# Patient Record
Sex: Female | Born: 1960 | Race: Black or African American | Hispanic: No | Marital: Married | Smoking: Current every day smoker
Health system: Southern US, Community
[De-identification: ages and names within clinical notes are randomized; demographics above are authoritative.]

## PROBLEM LIST (undated history)

## (undated) DIAGNOSIS — G43909 Migraine, unspecified, not intractable, without status migrainosus: Secondary | ICD-10-CM

## (undated) DIAGNOSIS — C50919 Malignant neoplasm of unspecified site of unspecified female breast: Secondary | ICD-10-CM

## (undated) DIAGNOSIS — Z923 Personal history of irradiation: Secondary | ICD-10-CM

## (undated) DIAGNOSIS — F329 Major depressive disorder, single episode, unspecified: Secondary | ICD-10-CM

## (undated) DIAGNOSIS — F32A Depression, unspecified: Secondary | ICD-10-CM

## (undated) DIAGNOSIS — I10 Essential (primary) hypertension: Secondary | ICD-10-CM

## (undated) DIAGNOSIS — R011 Cardiac murmur, unspecified: Secondary | ICD-10-CM

## (undated) DIAGNOSIS — K219 Gastro-esophageal reflux disease without esophagitis: Secondary | ICD-10-CM

## (undated) DIAGNOSIS — R7303 Prediabetes: Secondary | ICD-10-CM

## (undated) DIAGNOSIS — C50912 Malignant neoplasm of unspecified site of left female breast: Secondary | ICD-10-CM

## (undated) HISTORY — PX: DILATION AND CURETTAGE OF UTERUS: SHX78

## (undated) HISTORY — PX: COLONOSCOPY: SHX174

## (undated) HISTORY — PX: TUBAL LIGATION: SHX77

## (undated) HISTORY — DX: Prediabetes: R73.03

---

## 1997-08-07 ENCOUNTER — Ambulatory Visit (HOSPITAL_COMMUNITY): Admission: RE | Admit: 1997-08-07 | Discharge: 1997-08-07 | Payer: Self-pay | Admitting: *Deleted

## 1999-11-07 ENCOUNTER — Emergency Department (HOSPITAL_COMMUNITY): Admission: EM | Admit: 1999-11-07 | Discharge: 1999-11-07 | Payer: Self-pay | Admitting: Emergency Medicine

## 2001-02-14 ENCOUNTER — Ambulatory Visit (HOSPITAL_COMMUNITY): Admission: RE | Admit: 2001-02-14 | Discharge: 2001-02-14 | Payer: Self-pay | Admitting: Emergency Medicine

## 2001-02-14 ENCOUNTER — Encounter: Payer: Self-pay | Admitting: Emergency Medicine

## 2001-12-14 ENCOUNTER — Other Ambulatory Visit: Admission: RE | Admit: 2001-12-14 | Discharge: 2001-12-14 | Payer: Self-pay | Admitting: Emergency Medicine

## 2002-12-26 ENCOUNTER — Other Ambulatory Visit: Admission: RE | Admit: 2002-12-26 | Discharge: 2002-12-26 | Payer: Self-pay | Admitting: Emergency Medicine

## 2003-01-23 ENCOUNTER — Encounter: Payer: Self-pay | Admitting: Emergency Medicine

## 2003-01-23 ENCOUNTER — Encounter: Admission: RE | Admit: 2003-01-23 | Discharge: 2003-01-23 | Payer: Self-pay | Admitting: Emergency Medicine

## 2003-12-29 ENCOUNTER — Other Ambulatory Visit: Admission: RE | Admit: 2003-12-29 | Discharge: 2003-12-29 | Payer: Self-pay | Admitting: Emergency Medicine

## 2004-01-08 DIAGNOSIS — C50912 Malignant neoplasm of unspecified site of left female breast: Secondary | ICD-10-CM

## 2004-01-08 HISTORY — PX: BREAST LUMPECTOMY: SHX2

## 2004-01-08 HISTORY — PX: BREAST BIOPSY: SHX20

## 2004-01-08 HISTORY — DX: Malignant neoplasm of unspecified site of left female breast: C50.912

## 2004-01-26 ENCOUNTER — Encounter: Admission: RE | Admit: 2004-01-26 | Discharge: 2004-01-26 | Payer: Self-pay | Admitting: Emergency Medicine

## 2004-01-29 ENCOUNTER — Encounter (INDEPENDENT_AMBULATORY_CARE_PROVIDER_SITE_OTHER): Payer: Self-pay | Admitting: *Deleted

## 2004-01-29 ENCOUNTER — Encounter: Admission: RE | Admit: 2004-01-29 | Discharge: 2004-01-29 | Payer: Self-pay | Admitting: Emergency Medicine

## 2004-01-29 ENCOUNTER — Encounter (INDEPENDENT_AMBULATORY_CARE_PROVIDER_SITE_OTHER): Payer: Self-pay | Admitting: Diagnostic Radiology

## 2004-02-03 ENCOUNTER — Encounter: Admission: RE | Admit: 2004-02-03 | Discharge: 2004-02-03 | Payer: Self-pay | Admitting: General Surgery

## 2004-02-09 ENCOUNTER — Ambulatory Visit (HOSPITAL_COMMUNITY): Admission: RE | Admit: 2004-02-09 | Discharge: 2004-02-09 | Payer: Self-pay | Admitting: General Surgery

## 2004-02-09 ENCOUNTER — Encounter (INDEPENDENT_AMBULATORY_CARE_PROVIDER_SITE_OTHER): Payer: Self-pay | Admitting: *Deleted

## 2004-02-09 ENCOUNTER — Encounter: Admission: RE | Admit: 2004-02-09 | Discharge: 2004-02-09 | Payer: Self-pay | Admitting: General Surgery

## 2004-02-09 ENCOUNTER — Ambulatory Visit (HOSPITAL_BASED_OUTPATIENT_CLINIC_OR_DEPARTMENT_OTHER): Admission: RE | Admit: 2004-02-09 | Discharge: 2004-02-09 | Payer: Self-pay | Admitting: General Surgery

## 2004-02-09 DIAGNOSIS — D0512 Intraductal carcinoma in situ of left breast: Secondary | ICD-10-CM | POA: Insufficient documentation

## 2004-02-09 HISTORY — DX: Intraductal carcinoma in situ of left breast: D05.12

## 2004-02-24 ENCOUNTER — Ambulatory Visit: Admission: RE | Admit: 2004-02-24 | Discharge: 2004-05-13 | Payer: Self-pay | Admitting: Radiation Oncology

## 2004-04-21 ENCOUNTER — Ambulatory Visit: Payer: Self-pay | Admitting: Oncology

## 2004-06-16 ENCOUNTER — Ambulatory Visit: Admission: RE | Admit: 2004-06-16 | Discharge: 2004-06-16 | Payer: Self-pay | Admitting: Radiation Oncology

## 2004-06-16 ENCOUNTER — Ambulatory Visit: Payer: Self-pay | Admitting: Oncology

## 2004-11-19 ENCOUNTER — Ambulatory Visit: Admission: RE | Admit: 2004-11-19 | Discharge: 2004-11-19 | Payer: Self-pay | Admitting: Radiation Oncology

## 2005-01-03 ENCOUNTER — Other Ambulatory Visit: Admission: RE | Admit: 2005-01-03 | Discharge: 2005-01-03 | Payer: Self-pay | Admitting: Emergency Medicine

## 2005-01-12 ENCOUNTER — Encounter: Admission: RE | Admit: 2005-01-12 | Discharge: 2005-01-12 | Payer: Self-pay | Admitting: Emergency Medicine

## 2005-01-26 ENCOUNTER — Encounter: Admission: RE | Admit: 2005-01-26 | Discharge: 2005-01-26 | Payer: Self-pay | Admitting: Oncology

## 2005-02-02 ENCOUNTER — Ambulatory Visit: Payer: Self-pay | Admitting: Oncology

## 2005-08-03 ENCOUNTER — Ambulatory Visit: Payer: Self-pay | Admitting: Oncology

## 2006-01-10 ENCOUNTER — Other Ambulatory Visit: Admission: RE | Admit: 2006-01-10 | Discharge: 2006-01-10 | Payer: Self-pay | Admitting: Obstetrics and Gynecology

## 2006-01-27 ENCOUNTER — Encounter: Admission: RE | Admit: 2006-01-27 | Discharge: 2006-01-27 | Payer: Self-pay | Admitting: Oncology

## 2006-02-21 ENCOUNTER — Ambulatory Visit: Payer: Self-pay | Admitting: Oncology

## 2006-05-31 ENCOUNTER — Emergency Department (HOSPITAL_COMMUNITY): Admission: EM | Admit: 2006-05-31 | Discharge: 2006-05-31 | Payer: Self-pay | Admitting: Emergency Medicine

## 2006-08-31 ENCOUNTER — Ambulatory Visit: Payer: Self-pay | Admitting: Oncology

## 2006-09-01 LAB — COMPREHENSIVE METABOLIC PANEL
ALT: 13 U/L (ref 0–35)
AST: 15 U/L (ref 0–37)
Albumin: 4.2 g/dL (ref 3.5–5.2)
Alkaline Phosphatase: 100 U/L (ref 39–117)
Glucose, Bld: 83 mg/dL (ref 70–99)
Potassium: 3.2 mEq/L — ABNORMAL LOW (ref 3.5–5.3)
Sodium: 141 mEq/L (ref 135–145)
Total Bilirubin: 0.3 mg/dL (ref 0.3–1.2)
Total Protein: 7.4 g/dL (ref 6.0–8.3)

## 2006-09-01 LAB — CBC WITH DIFFERENTIAL/PLATELET
BASO%: 4.6 % — ABNORMAL HIGH (ref 0.0–2.0)
Eosinophils Absolute: 0.1 10*3/uL (ref 0.0–0.5)
LYMPH%: 31.2 % (ref 14.0–48.0)
MCHC: 35.7 g/dL (ref 32.0–36.0)
MCV: 99 fL (ref 81.0–101.0)
MONO%: 7.5 % (ref 0.0–13.0)
NEUT#: 4.3 10*3/uL (ref 1.5–6.5)
RBC: 3.64 10*6/uL — ABNORMAL LOW (ref 3.70–5.32)
RDW: 12.6 % (ref 11.3–14.5)
WBC: 7.8 10*3/uL (ref 3.9–10.0)

## 2006-11-07 ENCOUNTER — Emergency Department (HOSPITAL_COMMUNITY): Admission: EM | Admit: 2006-11-07 | Discharge: 2006-11-07 | Payer: Self-pay | Admitting: Family Medicine

## 2006-11-21 ENCOUNTER — Ambulatory Visit (HOSPITAL_COMMUNITY): Admission: RE | Admit: 2006-11-21 | Discharge: 2006-11-21 | Payer: Self-pay | Admitting: Obstetrics and Gynecology

## 2006-11-21 ENCOUNTER — Encounter (INDEPENDENT_AMBULATORY_CARE_PROVIDER_SITE_OTHER): Payer: Self-pay | Admitting: Obstetrics and Gynecology

## 2007-01-30 ENCOUNTER — Encounter: Admission: RE | Admit: 2007-01-30 | Discharge: 2007-01-30 | Payer: Self-pay | Admitting: Oncology

## 2007-01-30 ENCOUNTER — Other Ambulatory Visit: Admission: RE | Admit: 2007-01-30 | Discharge: 2007-01-30 | Payer: Self-pay | Admitting: Obstetrics and Gynecology

## 2007-02-06 ENCOUNTER — Encounter: Admission: RE | Admit: 2007-02-06 | Discharge: 2007-02-06 | Payer: Self-pay | Admitting: Oncology

## 2007-02-23 ENCOUNTER — Emergency Department (HOSPITAL_COMMUNITY): Admission: EM | Admit: 2007-02-23 | Discharge: 2007-02-23 | Payer: Self-pay | Admitting: Emergency Medicine

## 2007-03-06 ENCOUNTER — Ambulatory Visit: Payer: Self-pay | Admitting: Oncology

## 2007-03-08 LAB — COMPREHENSIVE METABOLIC PANEL
ALT: 15 U/L (ref 0–35)
Albumin: 4.5 g/dL (ref 3.5–5.2)
Alkaline Phosphatase: 100 U/L (ref 39–117)
Glucose, Bld: 92 mg/dL (ref 70–99)
Potassium: 3.5 mEq/L (ref 3.5–5.3)
Sodium: 141 mEq/L (ref 135–145)
Total Protein: 7.5 g/dL (ref 6.0–8.3)

## 2007-03-08 LAB — CBC WITH DIFFERENTIAL/PLATELET
BASO%: 0.6 % (ref 0.0–2.0)
HCT: 37.4 % (ref 34.8–46.6)
LYMPH%: 30.2 % (ref 14.0–48.0)
MCH: 35.4 pg — ABNORMAL HIGH (ref 26.0–34.0)
MCHC: 35.5 g/dL (ref 32.0–36.0)
MCV: 99.9 fL (ref 81.0–101.0)
MONO%: 7 % (ref 0.0–13.0)
NEUT%: 61 % (ref 39.6–76.8)
Platelets: 343 10*3/uL (ref 145–400)
RBC: 3.75 10*6/uL (ref 3.70–5.32)

## 2007-04-21 ENCOUNTER — Emergency Department (HOSPITAL_COMMUNITY): Admission: EM | Admit: 2007-04-21 | Discharge: 2007-04-21 | Payer: Self-pay | Admitting: Emergency Medicine

## 2007-04-23 ENCOUNTER — Ambulatory Visit: Payer: Self-pay | Admitting: Oncology

## 2007-08-18 ENCOUNTER — Emergency Department (HOSPITAL_COMMUNITY): Admission: EM | Admit: 2007-08-18 | Discharge: 2007-08-18 | Payer: Self-pay | Admitting: Emergency Medicine

## 2007-09-05 ENCOUNTER — Ambulatory Visit: Payer: Self-pay | Admitting: Oncology

## 2007-09-14 LAB — CBC WITH DIFFERENTIAL/PLATELET
Basophils Absolute: 0 10*3/uL (ref 0.0–0.1)
EOS%: 2.8 % (ref 0.0–7.0)
HCT: 35.3 % (ref 34.8–46.6)
HGB: 12.7 g/dL (ref 11.6–15.9)
MCH: 35.7 pg — ABNORMAL HIGH (ref 26.0–34.0)
MCV: 99.3 fL (ref 81.0–101.0)
MONO%: 7.1 % (ref 0.0–13.0)
NEUT%: 59.1 % (ref 39.6–76.8)

## 2007-09-17 LAB — COMPREHENSIVE METABOLIC PANEL
CO2: 22 mEq/L (ref 19–32)
Calcium: 9.6 mg/dL (ref 8.4–10.5)
Chloride: 107 mEq/L (ref 96–112)
Creatinine, Ser: 0.74 mg/dL (ref 0.40–1.20)
Glucose, Bld: 90 mg/dL (ref 70–99)
Total Bilirubin: 0.2 mg/dL — ABNORMAL LOW (ref 0.3–1.2)
Total Protein: 7.2 g/dL (ref 6.0–8.3)

## 2007-09-17 LAB — CANCER ANTIGEN 27.29: CA 27.29: 18 U/mL (ref 0–39)

## 2007-09-17 LAB — LACTATE DEHYDROGENASE: LDH: 157 U/L (ref 94–250)

## 2007-12-20 ENCOUNTER — Emergency Department (HOSPITAL_COMMUNITY): Admission: EM | Admit: 2007-12-20 | Discharge: 2007-12-20 | Payer: Self-pay | Admitting: Family Medicine

## 2008-01-31 ENCOUNTER — Encounter: Admission: RE | Admit: 2008-01-31 | Discharge: 2008-01-31 | Payer: Self-pay | Admitting: Oncology

## 2008-03-26 ENCOUNTER — Ambulatory Visit: Payer: Self-pay | Admitting: Oncology

## 2008-03-27 ENCOUNTER — Other Ambulatory Visit: Admission: RE | Admit: 2008-03-27 | Discharge: 2008-03-27 | Payer: Self-pay | Admitting: Obstetrics and Gynecology

## 2008-05-06 ENCOUNTER — Ambulatory Visit: Payer: Self-pay | Admitting: Oncology

## 2008-05-08 LAB — CBC WITH DIFFERENTIAL/PLATELET
Basophils Absolute: 0.1 10*3/uL (ref 0.0–0.1)
EOS%: 1.4 % (ref 0.0–7.0)
HGB: 13.4 g/dL (ref 11.6–15.9)
MCH: 34.8 pg — ABNORMAL HIGH (ref 26.0–34.0)
MCV: 98.3 fL (ref 81.0–101.0)
MONO%: 6.7 % (ref 0.0–13.0)
NEUT%: 54.9 % (ref 39.6–76.8)
RDW: 11.4 % (ref 11.3–14.5)

## 2008-05-08 LAB — COMPREHENSIVE METABOLIC PANEL
AST: 18 U/L (ref 0–37)
Alkaline Phosphatase: 85 U/L (ref 39–117)
BUN: 13 mg/dL (ref 6–23)
Creatinine, Ser: 0.73 mg/dL (ref 0.40–1.20)
Potassium: 3.5 mEq/L (ref 3.5–5.3)
Total Bilirubin: 0.3 mg/dL (ref 0.3–1.2)

## 2008-10-08 ENCOUNTER — Emergency Department (HOSPITAL_COMMUNITY): Admission: EM | Admit: 2008-10-08 | Discharge: 2008-10-08 | Payer: Self-pay | Admitting: Family Medicine

## 2008-11-25 ENCOUNTER — Ambulatory Visit: Payer: Self-pay | Admitting: Oncology

## 2008-11-27 LAB — CBC WITH DIFFERENTIAL/PLATELET
BASO%: 0.5 % (ref 0.0–2.0)
EOS%: 0.9 % (ref 0.0–7.0)
MCH: 34.4 pg — ABNORMAL HIGH (ref 25.1–34.0)
MCHC: 34.5 g/dL (ref 31.5–36.0)
NEUT%: 62.4 % (ref 38.4–76.8)
RBC: 3.99 10*6/uL (ref 3.70–5.45)
RDW: 12.2 % (ref 11.2–14.5)
lymph#: 2.1 10*3/uL (ref 0.9–3.3)

## 2008-11-27 LAB — COMPREHENSIVE METABOLIC PANEL
ALT: 34 U/L (ref 0–35)
AST: 57 U/L — ABNORMAL HIGH (ref 0–37)
Calcium: 9.5 mg/dL (ref 8.4–10.5)
Chloride: 102 mEq/L (ref 96–112)
Creatinine, Ser: 0.84 mg/dL (ref 0.40–1.20)
Potassium: 3 mEq/L — ABNORMAL LOW (ref 3.5–5.3)

## 2009-01-21 ENCOUNTER — Encounter: Admission: RE | Admit: 2009-01-21 | Discharge: 2009-01-21 | Payer: Self-pay | Admitting: Oncology

## 2009-03-16 ENCOUNTER — Emergency Department (HOSPITAL_COMMUNITY): Admission: EM | Admit: 2009-03-16 | Discharge: 2009-03-16 | Payer: Self-pay | Admitting: Emergency Medicine

## 2009-06-23 ENCOUNTER — Other Ambulatory Visit: Admission: RE | Admit: 2009-06-23 | Discharge: 2009-06-23 | Payer: Self-pay | Admitting: Obstetrics and Gynecology

## 2009-07-15 ENCOUNTER — Ambulatory Visit: Payer: Self-pay | Admitting: Oncology

## 2009-08-07 LAB — COMPREHENSIVE METABOLIC PANEL
ALT: 26 U/L (ref 0–35)
AST: 27 U/L (ref 0–37)
Alkaline Phosphatase: 120 U/L — ABNORMAL HIGH (ref 39–117)
BUN: 12 mg/dL (ref 6–23)
CO2: 22 mEq/L (ref 19–32)
Chloride: 102 mEq/L (ref 96–112)
Glucose, Bld: 121 mg/dL — ABNORMAL HIGH (ref 70–99)
Potassium: 3.7 mEq/L (ref 3.5–5.3)
Sodium: 138 mEq/L (ref 135–145)
Total Protein: 7.4 g/dL (ref 6.0–8.3)

## 2009-08-07 LAB — CBC WITH DIFFERENTIAL/PLATELET
BASO%: 0.3 % (ref 0.0–2.0)
Basophils Absolute: 0 10*3/uL (ref 0.0–0.1)
MCH: 33.6 pg (ref 25.1–34.0)
MCHC: 34.3 g/dL (ref 31.5–36.0)
MCV: 97.9 fL (ref 79.5–101.0)
MONO#: 0.5 10*3/uL (ref 0.1–0.9)
MONO%: 7.6 % (ref 0.0–14.0)
RBC: 3.84 10*6/uL (ref 3.70–5.45)
RDW: 12.4 % (ref 11.2–14.5)
WBC: 6.5 10*3/uL (ref 3.9–10.3)

## 2010-01-22 ENCOUNTER — Encounter: Admission: RE | Admit: 2010-01-22 | Discharge: 2010-01-22 | Payer: Self-pay | Admitting: Oncology

## 2010-05-30 ENCOUNTER — Encounter: Payer: Self-pay | Admitting: Emergency Medicine

## 2010-08-24 ENCOUNTER — Encounter (HOSPITAL_BASED_OUTPATIENT_CLINIC_OR_DEPARTMENT_OTHER): Payer: BC Managed Care – PPO | Admitting: Oncology

## 2010-08-24 ENCOUNTER — Other Ambulatory Visit: Payer: Self-pay | Admitting: Oncology

## 2010-08-24 DIAGNOSIS — C50519 Malignant neoplasm of lower-outer quadrant of unspecified female breast: Secondary | ICD-10-CM

## 2010-08-24 DIAGNOSIS — Z87891 Personal history of nicotine dependence: Secondary | ICD-10-CM

## 2010-08-24 LAB — CBC WITH DIFFERENTIAL/PLATELET
BASO%: 0.4 % (ref 0.0–2.0)
Eosinophils Absolute: 0.1 10*3/uL (ref 0.0–0.5)
HGB: 13.6 g/dL (ref 11.6–15.9)
MCH: 34.3 pg — ABNORMAL HIGH (ref 25.1–34.0)
MCV: 96.7 fL (ref 79.5–101.0)
MONO#: 0.5 10*3/uL (ref 0.1–0.9)
MONO%: 6.3 % (ref 0.0–14.0)
NEUT#: 4.3 10*3/uL (ref 1.5–6.5)
NEUT%: 51.2 % (ref 38.4–76.8)
Platelets: 392 10*3/uL (ref 145–400)
WBC: 8.4 10*3/uL (ref 3.9–10.3)

## 2010-08-24 LAB — COMPREHENSIVE METABOLIC PANEL
ALT: 24 U/L (ref 0–35)
Albumin: 3.9 g/dL (ref 3.5–5.2)
Calcium: 9.4 mg/dL (ref 8.4–10.5)
Glucose, Bld: 88 mg/dL (ref 70–99)

## 2010-08-31 ENCOUNTER — Encounter (HOSPITAL_BASED_OUTPATIENT_CLINIC_OR_DEPARTMENT_OTHER): Payer: BC Managed Care – PPO | Admitting: Oncology

## 2010-08-31 DIAGNOSIS — D0590 Unspecified type of carcinoma in situ of unspecified breast: Secondary | ICD-10-CM

## 2010-09-01 ENCOUNTER — Other Ambulatory Visit: Payer: Self-pay | Admitting: Oncology

## 2010-09-01 DIAGNOSIS — Z9889 Other specified postprocedural states: Secondary | ICD-10-CM

## 2010-09-21 NOTE — Op Note (Signed)
NAME:  Kristin Rose, Kristin Rose NO.:  [id number]   MEDICAL RECORD NO.:  [id number]          PATIENT TYPE:  AMB   LOCATION:  SDC                           FACILITY:  [facility]   PHYSICIAN:  Gerald Leitz, MD          DATE OF BIRTH:  Apr 08, 1961   DATE OF PROCEDURE:  11/21/2006  DATE OF DISCHARGE:                               OPERATIVE REPORT   PREOPERATIVE DIAGNOSIS:  Abnormal uterine bleeding.   POSTOPERATIVE DIAGNOSIS:  Abnormal uterine bleeding.   OPERATION PERFORMED:  Hysteroscopy, dilation and curettage.   SURGEON:  Gerald Leitz, MD   ASSISTANT:  None.   ANESTHESIA:  General.   SPECIMENS:  Endometrial curettings.  Disposition of specimen:  Sent to  pathology.   ESTIMATED BLOOD LOSS:  Less than 5 mL.   COMPLICATIONS:  None.   INDICATIONS FOR PROCEDURE:  This is a 50 year old with history of breast  cancer currently on Tamoxifen therapy with abnormal uterine bleeding,  inadequate endometrial biopsy in the office undergoing hysteroscopy,  dilation and curettage to rule out hyperplasia or malignancy.   DESCRIPTION OF PROCEDURE:  After informed consent was obtained, the  patient was taken to the operating room where she was placed under  general anesthesia.  She was placed in the dorsal lithotomy position.  She was prepped and draped in the usual sterile fashion.  In and out  catheterization was performed with approximately 50 mL of urine obtained  prior to the procedure.  Bivalved speculum was placed into the vaginal  vault.  The anterior lip of the cervix was grasped with single toothed  tenaculum.  The uterus was sounded to 7 cm.  It was then dilated to  approximately #16 cervical dilator. The hysteroscope was inserted.  The  patient was found to have normal-appearing endometrium.  There were no  polyps noted.  The hysteroscope was removed and sharp curettage was  performed all the way around until a gritty texture was noted.  Endometrial curettings were sent to  pathology.  Hysteroscope was then  reinserted and the patient was noted to have no uterine perforations.  Hysteroscope was removed.  The single toothed tenaculum was removed from  the anterior lip of the cervix.  Tenaculum sites were found to be  hemostatic.  Bivalve speculum was removed.  Please note that prior to  sounding the uterus that a paracervical block was  performed using 5 mL of 0.25% Marcaine injected at 4 and 8 o'clock  positions.  The patient was awakened from anesthesia.  She was taken to  the recovery room awake and in stable condition.   FINDINGS AT SURGERY:  Normal appearing endometrial cavity.      Gerald Leitz, MD  Electronically Signed     TC/MEDQ  D:  11/21/2006  T:  11/22/2006  Job:  [id number]

## 2010-09-21 NOTE — H&P (Signed)
NAME:  Kristin Rose, BANGERTER NO.:  [id number]   MEDICAL RECORD NO.:  [id number]          PATIENT TYPE:  AMB   LOCATION:  SDC                           FACILITY:  [facility]   PHYSICIAN:  Gerald Leitz, MD          DATE OF BIRTH:  09-22-1960   DATE OF ADMISSION:  11/21/2006  DATE OF DISCHARGE:                              HISTORY & PHYSICAL   HISTORY OF PRESENT ILLNESS:  This is a 50 year old with abnormal uterine  bleeding.  She has a history of breast cancer currently off of therapy.  She has not had a period since 2006 began again in March 2008. She had  an ultrasound which showed multiple uterine fibroids, endometrium was  difficult to visualize due to fibroids however was thought to be 0.33  cm. This ultrasound was performed on May of 2008. The patient has  continued to have abnormal uterine bleeding and is scheduled for  hysteroscopy D&C.   PAST MEDICAL HISTORY:  Hypertension, migraine headaches, history of  breast cancer.   PAST SURGICAL HISTORY:  Lumpectomy in October 2005, tube ligation in  1997.   GYN HISTORY:  HSV2. Contraception tubal ligation. Last Pap smear  September 2007 was normal.   MEDICATIONS:  Effexor XR, Topamax, Imitrex, Ambien p.r.n., Toprol, and  hydrochlorothiazide as well as Tamoxifen.   ALLERGIES:  NO KNOWN DRUG ALLERGIES.   OB HISTORY:  Vaginal delivery x1, elective abortion x1.   SOCIAL HISTORY:  The patient is an Herbalist. She smokes less  than half pack a day.  Occasional glass of wine, no illicit drug use.   REVIEW OF SYSTEMS:  Negative except as stated in history of current  illness.   FAMILY HISTORY:  Negative for breast, ovarian or colon cancer.   PHYSICAL EXAMINATION:  VITAL SIGNS: Blood pressure 140/86, weight 184  pounds.  CARDIOVASCULAR: Regular rate rhythm.  LUNGS: Clear to auscultation bilaterally.  ABDOMEN:  Soft, nontender, nondistended.  Positive bowel sounds.  EXTREMITIES: No clubbing, cyanosis or edema.  PELVIC:  Normal external female genitalia.  No vulvovaginal cervical  lesions noted.  Bimanual exam reveals slightly enlarged uterus  approximately 10-week size.  No adnexal masses or tenderness.   IMPRESSION AND PLAN:  Abnormal uterine bleeding scheduled for  hysteroscopy and dilation and curettage to rule out hyperplasia or  malignancy. Risks, benefits and alternatives to surgery were discussed  with the patient  including but not limited to infection, bleeding, possible uterine  perforation with need for further surgery, risk of transfusion HIV,  hepatitis B and C were discussed.  The patient understands all risks  desires to proceed with hysteroscopy D&C.      Gerald Leitz, MD  Electronically Signed     TC/MEDQ  D:  11/14/2006  T:  11/14/2006  Job:  [id number]

## 2010-10-21 ENCOUNTER — Other Ambulatory Visit (HOSPITAL_COMMUNITY)
Admission: RE | Admit: 2010-10-21 | Discharge: 2010-10-21 | Disposition: A | Discharge status: Home / Self Care | Payer: BC Managed Care – PPO | Source: Ambulatory Visit | Attending: Obstetrics and Gynecology | Admitting: Obstetrics and Gynecology

## 2010-10-21 ENCOUNTER — Other Ambulatory Visit: Payer: Self-pay | Admitting: Obstetrics and Gynecology

## 2010-10-21 DIAGNOSIS — Z01419 Encounter for gynecological examination (general) (routine) without abnormal findings: Secondary | ICD-10-CM | POA: Insufficient documentation

## 2011-01-28 ENCOUNTER — Ambulatory Visit
Admission: RE | Admit: 2011-01-28 | Discharge: 2011-01-28 | Disposition: A | Discharge status: Home / Self Care | Payer: BC Managed Care – PPO | Source: Ambulatory Visit | Attending: Oncology | Admitting: Oncology

## 2011-01-28 DIAGNOSIS — Z9889 Other specified postprocedural states: Secondary | ICD-10-CM

## 2011-02-21 LAB — SAMPLE TO BLOOD BANK

## 2011-02-22 LAB — CBC
HCT: 37.8
Hemoglobin: 12.9
Platelets: 357
RDW: 12.5
WBC: 7.3

## 2011-02-22 LAB — BASIC METABOLIC PANEL
BUN: 12
Calcium: 9.5
GFR calc non Af Amer: >60
Glucose, Bld: 84
Potassium: 4
Sodium: 140

## 2011-03-12 ENCOUNTER — Inpatient Hospital Stay (INDEPENDENT_AMBULATORY_CARE_PROVIDER_SITE_OTHER)
Admission: RE | Admit: 2011-03-12 | Discharge: 2011-03-12 | Disposition: A | Discharge status: Home / Self Care | Payer: BC Managed Care – PPO | Source: Ambulatory Visit | Attending: Family Medicine | Admitting: Family Medicine

## 2011-03-12 DIAGNOSIS — J069 Acute upper respiratory infection, unspecified: Secondary | ICD-10-CM

## 2011-03-12 DIAGNOSIS — J019 Acute sinusitis, unspecified: Secondary | ICD-10-CM

## 2011-03-15 ENCOUNTER — Telehealth (HOSPITAL_COMMUNITY): Payer: Self-pay | Admitting: *Deleted

## 2011-03-15 NOTE — ED Notes (Signed)
Pt. Called @ [phone] and requested a work note.  States she was here 11/3 and returned to work 11/6. Discussed with Dr. Ladon Applebaum and he approved note. Note done as directed and put at the front desk. I called pt. and left a message that note was completed and where to pick it up.

## 2011-05-12 ENCOUNTER — Ambulatory Visit (HOSPITAL_COMMUNITY)
Admission: RE | Admit: 2011-05-12 | Discharge: 2011-05-12 | Disposition: A | Discharge status: Home / Self Care | Payer: Managed Care, Other (non HMO) | Source: Ambulatory Visit | Attending: Family Medicine | Admitting: Family Medicine

## 2011-05-12 DIAGNOSIS — M79609 Pain in unspecified limb: Secondary | ICD-10-CM | POA: Insufficient documentation

## 2011-05-12 DIAGNOSIS — R52 Pain, unspecified: Secondary | ICD-10-CM

## 2011-05-12 DIAGNOSIS — R609 Edema, unspecified: Secondary | ICD-10-CM

## 2011-05-12 DIAGNOSIS — M7989 Other specified soft tissue disorders: Secondary | ICD-10-CM | POA: Insufficient documentation

## 2011-05-12 NOTE — Progress Notes (Signed)
*  PRELIMINARY RESULTS*  Bilateral lower extremity venous duplex  has been performed.  Bilateral:  No evidence of DVT, superficial thrombosis, or Baker's Cyst.    [name] 05/12/2011, 7:23 PM

## 2011-08-24 ENCOUNTER — Other Ambulatory Visit (HOSPITAL_BASED_OUTPATIENT_CLINIC_OR_DEPARTMENT_OTHER): Payer: Managed Care, Other (non HMO)

## 2011-08-24 DIAGNOSIS — D0590 Unspecified type of carcinoma in situ of unspecified breast: Secondary | ICD-10-CM

## 2011-08-24 LAB — COMPREHENSIVE METABOLIC PANEL
Albumin: 4 g/dL (ref 3.5–5.2)
BUN: 14 mg/dL (ref 6–23)
CO2: 24 mEq/L (ref 19–32)
Calcium: 9.6 mg/dL (ref 8.4–10.5)
Chloride: 102 mEq/L (ref 96–112)
Glucose, Bld: 101 mg/dL — ABNORMAL HIGH (ref 70–99)
Potassium: 3.3 mEq/L — ABNORMAL LOW (ref 3.5–5.3)
Sodium: 139 mEq/L (ref 135–145)
Total Protein: 7.7 g/dL (ref 6.0–8.3)

## 2011-08-24 LAB — CBC WITH DIFFERENTIAL/PLATELET
Basophils Absolute: 0.1 10*3/uL (ref 0.0–0.1)
Eosinophils Absolute: 0.1 10*3/uL (ref 0.0–0.5)
HCT: 38.9 % (ref 34.8–46.6)
HGB: 13.5 g/dL (ref 11.6–15.9)
NEUT#: 3.6 10*3/uL (ref 1.5–6.5)
NEUT%: 48 % (ref 38.4–76.8)
RDW: 12.3 % (ref 11.2–14.5)
lymph#: 3.2 10*3/uL (ref 0.9–3.3)

## 2011-08-25 LAB — VITAMIN D 25 HYDROXY (VIT D DEFICIENCY, FRACTURES): Vit D, 25-Hydroxy: 16 ng/mL — ABNORMAL LOW (ref 30–89)

## 2011-08-31 ENCOUNTER — Telehealth: Payer: Self-pay | Admitting: Oncology

## 2011-08-31 ENCOUNTER — Ambulatory Visit (HOSPITAL_BASED_OUTPATIENT_CLINIC_OR_DEPARTMENT_OTHER): Payer: Managed Care, Other (non HMO) | Admitting: Oncology

## 2011-08-31 VITALS — BP 121/79 | HR 82 | Temp 98.3°F | Ht 63.5 in | Wt 216.3 lb

## 2011-08-31 DIAGNOSIS — Z1231 Encounter for screening mammogram for malignant neoplasm of breast: Secondary | ICD-10-CM

## 2011-08-31 DIAGNOSIS — D0510 Intraductal carcinoma in situ of unspecified breast: Secondary | ICD-10-CM

## 2011-08-31 DIAGNOSIS — Z87898 Personal history of other specified conditions: Secondary | ICD-10-CM

## 2011-08-31 DIAGNOSIS — I10 Essential (primary) hypertension: Secondary | ICD-10-CM

## 2011-08-31 DIAGNOSIS — E559 Vitamin D deficiency, unspecified: Secondary | ICD-10-CM

## 2011-08-31 NOTE — Telephone Encounter (Signed)
gve the pt her April 2014 appt calendar along with the mammo/bone density appt

## 2011-08-31 NOTE — Patient Instructions (Signed)
PLEASE TAKE CALTRATE D ; 2 TIMES PER DAY PLEASE TAKE VITAMIN D3 2000 UNITS/DAY PLEASE CONTINUE TAKING THE POTASSIUM WE WILL SEE YOU IN 1 YEAR YOU NEED A BONE DENSITY TEST, A MAMMOGRAM AND MRI BEFORE YOU RETURN.

## 2011-08-31 NOTE — Telephone Encounter (Signed)
Scheduled the pt for her breast mri appt at [facility]

## 2012-01-04 ENCOUNTER — Telehealth: Payer: Self-pay | Admitting: *Deleted

## 2012-01-04 NOTE — Telephone Encounter (Signed)
CONFIRMED OVER THE  PHONE THE NEW DATE AND TIME OF MAMMOGRAM AND BONE DENSITY WITH THE MRI OF THE BREAST

## 2012-01-31 ENCOUNTER — Ambulatory Visit
Admission: RE | Admit: 2012-01-31 | Discharge: 2012-01-31 | Disposition: A | Discharge status: Home / Self Care | Payer: Managed Care, Other (non HMO) | Source: Ambulatory Visit | Attending: Oncology | Admitting: Oncology

## 2012-01-31 ENCOUNTER — Ambulatory Visit (HOSPITAL_COMMUNITY)
Admission: RE | Admit: 2012-01-31 | Discharge: 2012-01-31 | Disposition: A | Discharge status: Home / Self Care | Payer: Managed Care, Other (non HMO) | Source: Ambulatory Visit | Attending: Oncology | Admitting: Oncology

## 2012-01-31 ENCOUNTER — Other Ambulatory Visit: Payer: Self-pay | Admitting: Oncology

## 2012-01-31 DIAGNOSIS — R599 Enlarged lymph nodes, unspecified: Secondary | ICD-10-CM | POA: Insufficient documentation

## 2012-01-31 DIAGNOSIS — Z853 Personal history of malignant neoplasm of breast: Secondary | ICD-10-CM | POA: Insufficient documentation

## 2012-01-31 DIAGNOSIS — Z09 Encounter for follow-up examination after completed treatment for conditions other than malignant neoplasm: Secondary | ICD-10-CM | POA: Insufficient documentation

## 2012-01-31 DIAGNOSIS — D0510 Intraductal carcinoma in situ of unspecified breast: Secondary | ICD-10-CM

## 2012-01-31 DIAGNOSIS — Z1231 Encounter for screening mammogram for malignant neoplasm of breast: Secondary | ICD-10-CM

## 2012-01-31 DIAGNOSIS — E559 Vitamin D deficiency, unspecified: Secondary | ICD-10-CM

## 2012-01-31 DIAGNOSIS — R9389 Abnormal findings on diagnostic imaging of other specified body structures: Secondary | ICD-10-CM

## 2012-01-31 MED ORDER — GADOBENATE DIMEGLUMINE 529 MG/ML IV SOLN: 20.0000 mL | Freq: Once | INTRAVENOUS | Status: AC | PRN

## 2012-02-01 LAB — POCT I-STAT, CHEM 8
Creatinine, Ser: 0.9 mg/dL (ref 0.50–1.10)
HCT: 42 % (ref 36.0–46.0)
Hemoglobin: 14.3 g/dL (ref 12.0–15.0)
Sodium: 142 mEq/L (ref 135–145)
TCO2: 27 mmol/L (ref 0–100)

## 2012-02-02 ENCOUNTER — Ambulatory Visit
Admission: RE | Admit: 2012-02-02 | Discharge: 2012-02-02 | Disposition: A | Discharge status: Home / Self Care | Payer: Managed Care, Other (non HMO) | Source: Ambulatory Visit | Attending: Oncology | Admitting: Oncology

## 2012-02-02 DIAGNOSIS — R9389 Abnormal findings on diagnostic imaging of other specified body structures: Secondary | ICD-10-CM

## 2012-02-15 ENCOUNTER — Other Ambulatory Visit: Payer: Self-pay | Admitting: Obstetrics and Gynecology

## 2012-02-15 ENCOUNTER — Other Ambulatory Visit (HOSPITAL_COMMUNITY)
Admission: RE | Admit: 2012-02-15 | Discharge: 2012-02-15 | Disposition: A | Discharge status: Home / Self Care | Payer: Managed Care, Other (non HMO) | Source: Ambulatory Visit | Attending: Obstetrics and Gynecology | Admitting: Obstetrics and Gynecology

## 2012-02-15 DIAGNOSIS — Z01419 Encounter for gynecological examination (general) (routine) without abnormal findings: Secondary | ICD-10-CM | POA: Insufficient documentation

## 2012-06-06 NOTE — Progress Notes (Signed)
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 She has been doing well with no intercurrent illness.  She is on 3 different meds for her BP, she has occasional hot flashes and is on effexor.  ROS:  14 point ROSis negative.  No Known Allergies  Current Outpatient Prescriptions  Medication Sig Dispense Refill  . amLODipine (NORVASC) 10 MG tablet Take 10 mg by mouth daily.      . citalopram (CELEXA) 10 MG tablet Take 10 mg by mouth daily.      Marland Kitchen losartan-hydrochlorothiazide (HYZAAR) 50-12.5 MG per tablet Take 1 tablet by mouth daily.      . metoprolol (LOPRESSOR) 100 MG tablet Take 100 mg by mouth 2 (two) times daily.      . potassium chloride (KLOR-CON) 20 MEQ packet Take 20 mEq by mouth daily.      Marland Kitchen venlafaxine (EFFEXOR) 37.5 MG tablet Take 37.5 mg by mouth daily.      Marland Kitchen zolpidem (AMBIEN) 10 MG tablet Take 10 mg by mouth at bedtime as needed.      Marland Kitchen UNABLE TO FIND Trexamet  For migraines         Objective:  Filed Vitals:   08/31/11 0936  BP: 121/79  Pulse: 82  Temp: 98.3 F (36.8 C)    BMI: Body mass index is 37.71 kg/(m^2).   ECOG FS:  Physical Exam:   Sclerae unicteric  Oropharynx clear  No peripheral adenopathy  Lungs clear -- no rales or rhonchi  Heart regular rate and rhythm  Abdomen benign  MSK no focal spinal tenderness, no peripheral edema  Neuro nonfocal  Breast exam: both breast and axillae are free of masses.  Lab Results:      Chemistry      Component Value Date/Time   NA 142 01/31/2012 1200   K 3.6 01/31/2012 1200   CL 104 01/31/2012 1200   CO2 24 08/24/2011 1602   BUN 10 01/31/2012 1200   CREATININE 0.90 01/31/2012 1200      Component Value Date/Time   CALCIUM 9.6 08/24/2011 1602   ALKPHOS 148* 08/24/2011 1602   AST 22 08/24/2011  1602   ALT 15 08/24/2011 1602   BILITOT 0.2* 08/24/2011 1602       Lab Results  Component Value Date   WBC 7.6 08/24/2011   HGB 14.3 01/31/2012   HCT 42.0 01/31/2012   MCV 100.3 08/24/2011   PLT 361 08/24/2011   NEUTROABS 3.6 08/24/2011    Studies/Results:  No results found.  Assessment: 52 yo with hx of DCIS , s/p surgery and xrt and 5 yrs of tamoxifen. She is in 8th yr of f/u. Mammogram is due in 9/13.     Plan: 1 yr f/u is recommended with imaging studies     Kristin Rose

## 2012-06-29 ENCOUNTER — Telehealth: Payer: Self-pay | Admitting: *Deleted

## 2012-06-29 NOTE — Telephone Encounter (Signed)
Confirmed new appt date and time for 09/04/12 at 0900 with Larina Bras, NP.  Pt will continue care with Dr. Welton Flakes.

## 2012-07-09 ENCOUNTER — Encounter: Payer: Self-pay | Admitting: Oncology

## 2012-08-14 ENCOUNTER — Telehealth: Payer: Self-pay | Admitting: *Deleted

## 2012-08-14 NOTE — Telephone Encounter (Signed)
Left message for a return phone call to reschedule her appt. Due to Dr.Khan's day off.  Will await return phone call.

## 2012-08-28 ENCOUNTER — Other Ambulatory Visit: Payer: Managed Care, Other (non HMO) | Admitting: Lab

## 2012-09-04 ENCOUNTER — Ambulatory Visit: Payer: Managed Care, Other (non HMO) | Admitting: Oncology

## 2012-09-04 ENCOUNTER — Ambulatory Visit: Payer: Managed Care, Other (non HMO) | Admitting: Family

## 2012-09-29 ENCOUNTER — Emergency Department (INDEPENDENT_AMBULATORY_CARE_PROVIDER_SITE_OTHER)
Admission: EM | Admit: 2012-09-29 | Discharge: 2012-09-29 | Disposition: A | Discharge status: Home / Self Care | Payer: Managed Care, Other (non HMO) | Source: Home / Self Care | Attending: Emergency Medicine | Admitting: Emergency Medicine

## 2012-09-29 ENCOUNTER — Emergency Department (INDEPENDENT_AMBULATORY_CARE_PROVIDER_SITE_OTHER): Payer: Managed Care, Other (non HMO)

## 2012-09-29 ENCOUNTER — Encounter (HOSPITAL_COMMUNITY): Payer: Self-pay | Admitting: Emergency Medicine

## 2012-09-29 DIAGNOSIS — S43429A Sprain of unspecified rotator cuff capsule, initial encounter: Secondary | ICD-10-CM

## 2012-09-29 DIAGNOSIS — R509 Fever, unspecified: Secondary | ICD-10-CM

## 2012-09-29 DIAGNOSIS — S7000XA Contusion of unspecified hip, initial encounter: Secondary | ICD-10-CM

## 2012-09-29 DIAGNOSIS — L304 Erythema intertrigo: Secondary | ICD-10-CM

## 2012-09-29 DIAGNOSIS — S7001XA Contusion of right hip, initial encounter: Secondary | ICD-10-CM

## 2012-09-29 DIAGNOSIS — W19XXXA Unspecified fall, initial encounter: Secondary | ICD-10-CM

## 2012-09-29 DIAGNOSIS — S46011A Strain of muscle(s) and tendon(s) of the rotator cuff of right shoulder, initial encounter: Secondary | ICD-10-CM

## 2012-09-29 HISTORY — DX: Major depressive disorder, single episode, unspecified: F32.9

## 2012-09-29 HISTORY — DX: Essential (primary) hypertension: I10

## 2012-09-29 HISTORY — DX: Depression, unspecified: F32.A

## 2012-09-29 MED ORDER — ACETAMINOPHEN 325 MG PO TABS
650.0000 mg | ORAL_TABLET | Freq: Once | ORAL | Status: AC
  Administered 2012-09-29: 650 mg via ORAL

## 2012-09-29 MED ORDER — NAPROXEN 500 MG PO TABS: 500.0000 mg | ORAL_TABLET | Freq: Two times a day (BID) | ORAL | Status: DC

## 2012-09-29 MED ORDER — ACETAMINOPHEN 325 MG PO TABS
ORAL_TABLET | ORAL | Status: AC
  Filled 2012-09-29: qty 2

## 2012-09-29 MED ORDER — HYDROCODONE-ACETAMINOPHEN 5-325 MG PO TABS: ORAL_TABLET | ORAL | Status: DC

## 2012-09-29 NOTE — ED Notes (Signed)
Pt is having pain in right hip and shoulder

## 2012-09-29 NOTE — ED Provider Notes (Signed)
Chief Complaint:   Chief Complaint  Patient presents with  . Fall    History of Present Illness:   Kristin Rose is a-year-old female who fell today around 1 to 2 PM and a nail salon. She tripped on some decorative tile and fell forward, catching herself on her hands and knees. She did not hit her head and did not lose consciousness. She was able to get up on her own and has been ambulatory ever since then. She complains of some stiffness of her trapezius ridges, aching in the right shoulder and right hip, she has slight pain in the right knee, left wrist and left elbow. She denies any headache or neurological symptoms.  She also has had a mildly pruritic rash underneath both breasts, right worse than left.  She has not had any fever or chills at home. Today her temperature was a little bit elevated at 100.4. She denies any URI symptoms, cough, abdominal pain, nausea, vomiting, diarrhea, urinary symptoms, history of a tick bite, or skin rash.  Review of Systems:  Other than noted above, the patient denies any of the following symptoms: Systemic:  No fevers or chills. Eye:  No diplopia or blurred vision. ENT:  No headache, facial pain, or bleeding from the nose or ears.  No loose or broken teeth. Neck:  No neck pain or stiffnes. Resp:  No shortness of breath. Cardiac:  No chest pain. No palpitations, dizziness, syncope or fainting. GI:  No abdominal pain. No nausea, vomiting, or diarrhea. GU:  No blood in urine. M-S:  No extremity pain, swelling, bruising, limited ROM, neck or back pain. Neuro:  No headache, loss of consciousness, seizure activity, dizziness, vertigo, paresthesias, numbness, or weakness.  No difficulty with speech or ambulation.   PMFSH:  Past medical history, family history, social history, meds, and allergies were reviewed.  She has high blood pressure and depression and takes amlodipine, Celexa, Hyzaar, Lopressor, potassium chloride, Effexor, and Ambien.  Physical Exam:    Vital signs:  BP 137/83  Pulse 71  Temp(Src) 100.4 F (38 C) (Oral)  Resp 18  SpO2 97% General:  Alert, oriented and in no distress. Eye:  PERRL, full EOMs. ENT:  No cranial or facial tenderness to palpation. Neck:  There is slight tenderness to palpation over both trapezius ridges.  Full ROM without pain. Heart:  Regular rhythm.  No extrasystoles, gallops, or murmers. Lungs:  No chest wall tenderness to palpation. Breath sounds clear and equal bilaterally.  No wheezes, rales or rhonchi. Abdomen:  Non tender. Back:  Non tender to palpation.  Full ROM without pain. Extremities:  Exam of her right shoulder reveals mild pain to palpation, no swelling, bruising, or deformity. The shoulder has a full range of motion with slight pain on abduction, no pain with flexion, and no pain with internal or external rotation. Neer sign was negative, empty can sign was negative, Hawkins sign was negative exam of the right hip showed no swelling, bruising, or deformity. The hip have full range of motion with slight pain on internal and external rotation. There was slight pain to palpation of the greater trochanter. Exam of the knees reveals minimal pain to palpation bilaterally with full range of motion with no pain on movement. Extremity survey was otherwise unremarkable except for slight pain to palpation in the left wrist but full range of motion with no pain.  Full ROM of all joints without pain.  Pulses full.  Brisk capillary refill. Neuro:  Alert  and oriented times 3.  Cranial nerves intact.  No muscle weakness.  Sensation intact to light touch.  Gait normal. Skin:  No bruising, abrasions, or lacerations. She has an intertriginous rash underneath both breasts, right worse than left.  Radiology:  Dg Shoulder Right  09/29/2012   *RADIOLOGY REPORT*  Clinical Data: Fall today, posterior shoulder pain  RIGHT SHOULDER - 2+ VIEW  Comparison: None.  Findings: No fracture or dislocation.  No soft tissue abnormality.  No radiopaque foreign body.  AC joint distance is normal.  Right lung apex is clear.  IMPRESSION: No acute osseous abnormality in the right shoulder.   Original Report Authenticated By: Christiana Pellant, M.D.   Dg Hip Complete Right  09/29/2012   *RADIOLOGY REPORT*  Clinical Data: Fall, hip pain  RIGHT HIP - COMPLETE 2+ VIEW  Comparison: None.  Findings: No fracture or dislocation is seen.  Bilateral hip joint spaces are preserved.  Visualized bony pelvis appears intact.  IMPRESSION: No fracture or dislocation is seen.   Original Report Authenticated By: Charline Bills, M.D.    Course in Urgent Care Center:   Given Tylenol 650 mg for pain. She was placed in a shoulder sling.  Assessment:  The primary encounter diagnosis was Rotator cuff strain, right, initial encounter. Diagnoses of Fall, initial encounter, Contusion of right hip, initial encounter, Intertrigo, and Fever were also pertinent to this visit.  No evidence for fracture. Appears to be all soft tissue injury, but if pain persists suggested she return in one to 2 weeks for recheck.  Rash under the breasts is typical intertrigo.  Cause for low-grade fever or remains unknown. She's had no symptoms to explain this. I suggested that she monitor this and if it gets worse or any other symptoms develop, to return here for recheck.  Plan:   1.  The following meds were prescribed:   Discharge Medication List as of 09/29/2012  6:59 PM    START taking these medications   Details  HYDROcodone-acetaminophen (NORCO/VICODIN) 5-325 MG per tablet 1 to 2 tabs every 4 to 6 hours as needed for pain., Print    naproxen (NAPROSYN) 500 MG tablet Take 1 tablet (500 mg total) by mouth 2 (two) times daily., Starting 09/29/2012, Until Discontinued, Normal       2.  The patient was instructed in symptomatic care and handouts were given. 3.  The patient was told to return if becoming worse in any way, if no better in one to 2 weeks, and given some red flag  symptoms such as worsening fever, worsening pain, or new neurological symptoms that would indicate earlier return. 4.  Follow up here if no better in one to 2 weeks or if any new symptoms develop.    Reuben Likes, MD 09/29/12 [phone]

## 2012-09-29 NOTE — ED Notes (Signed)
Pt reports fall at nail salon. Pt states that her flip flop got caught in the tile and she fell forward. C/o hip and arm pain. Pt has taking aspirin for pain relief .

## 2012-09-30 ENCOUNTER — Telehealth (HOSPITAL_COMMUNITY): Payer: Self-pay | Admitting: *Deleted

## 2012-09-30 MED ORDER — NYSTATIN-TRIAMCINOLONE 100000-0.1 UNIT/GM-% EX OINT: TOPICAL_OINTMENT | Freq: Two times a day (BID) | CUTANEOUS | Status: DC

## 2012-09-30 NOTE — ED Notes (Signed)
[facility] pharmacist said the Rx. did come through. Kristin Rose 09/30/2012

## 2012-09-30 NOTE — ED Notes (Signed)
Pt. called on VM and said the Rx. for the cream was not sent to the pharmacy.  I called pt. and left a message to call. Kristin Rose 09/30/2012

## 2012-09-30 NOTE — ED Notes (Signed)
Pt. called back and I told her that I would ask another doctor about it.  She wants [facility] sent to the [facility] on [address].  Discussed with Dr. Tressia Danas and she said she would e-prescribe Triamcinolone/Nysatin cream.  I called pt. back and to tell her that the doctor sent it, but the e-prescribe was not working well today. I told her if it is not there to call back tomorrow and we could call it in.  Pt. voiced understanding. Kristin Rose 09/30/2012

## 2012-12-28 ENCOUNTER — Other Ambulatory Visit: Payer: Self-pay

## 2012-12-28 DIAGNOSIS — Z1231 Encounter for screening mammogram for malignant neoplasm of breast: Secondary | ICD-10-CM

## 2013-01-30 ENCOUNTER — Other Ambulatory Visit (HOSPITAL_COMMUNITY)
Admission: RE | Admit: 2013-01-30 | Discharge: 2013-01-30 | Disposition: A | Discharge status: Home / Self Care | Payer: Managed Care, Other (non HMO) | Source: Ambulatory Visit | Attending: Obstetrics and Gynecology | Admitting: Obstetrics and Gynecology

## 2013-01-30 ENCOUNTER — Other Ambulatory Visit: Payer: Self-pay | Admitting: Obstetrics and Gynecology

## 2013-01-30 DIAGNOSIS — Z01419 Encounter for gynecological examination (general) (routine) without abnormal findings: Secondary | ICD-10-CM | POA: Insufficient documentation

## 2013-01-30 DIAGNOSIS — R87810 Cervical high risk human papillomavirus (HPV) DNA test positive: Secondary | ICD-10-CM | POA: Insufficient documentation

## 2013-01-30 DIAGNOSIS — Z1151 Encounter for screening for human papillomavirus (HPV): Secondary | ICD-10-CM | POA: Insufficient documentation

## 2013-01-31 ENCOUNTER — Ambulatory Visit
Admission: RE | Admit: 2013-01-31 | Discharge: 2013-01-31 | Disposition: A | Discharge status: Home / Self Care | Payer: Managed Care, Other (non HMO) | Source: Ambulatory Visit

## 2013-01-31 DIAGNOSIS — Z1231 Encounter for screening mammogram for malignant neoplasm of breast: Secondary | ICD-10-CM

## 2013-05-09 ENCOUNTER — Encounter (HOSPITAL_COMMUNITY): Payer: Self-pay | Admitting: Emergency Medicine

## 2013-05-09 ENCOUNTER — Emergency Department (INDEPENDENT_AMBULATORY_CARE_PROVIDER_SITE_OTHER)
Admission: EM | Admit: 2013-05-09 | Discharge: 2013-05-09 | Disposition: A | Discharge status: Home / Self Care | Payer: Managed Care, Other (non HMO) | Source: Home / Self Care | Attending: Emergency Medicine | Admitting: Emergency Medicine

## 2013-05-09 DIAGNOSIS — J069 Acute upper respiratory infection, unspecified: Secondary | ICD-10-CM

## 2013-05-09 LAB — POCT RAPID STREP A: Streptococcus, Group A Screen (Direct): NEGATIVE

## 2013-05-09 MED ORDER — NAPROXEN 500 MG PO TABS: 500.0000 mg | ORAL_TABLET | Freq: Two times a day (BID) | ORAL | Status: DC

## 2013-05-09 MED ORDER — IPRATROPIUM BROMIDE 0.06 % NA SOLN: 2.0000 | Freq: Four times a day (QID) | NASAL | Status: DC

## 2013-05-09 MED ORDER — GUAIFENESIN-CODEINE 100-10 MG/5ML PO SYRP: 10.0000 mL | ORAL_SOLUTION | Freq: Four times a day (QID) | ORAL | Status: DC | PRN

## 2013-05-09 NOTE — ED Notes (Signed)
Reported ST, cough since Tuesday. Exposed to family, co-workers that have been ill w similar symptoms

## 2013-05-09 NOTE — ED Provider Notes (Signed)
Chief Complaint   Chief Complaint  Patient presents with  . Sore Throat    History of Present Illness   Kristin Rose is a 53 year old female who has had a three-day history of sore throat, chills, swelling in the neck, aching in the jaw, cough productive yellow, blood-tinged sputum, shortness of breath, nasal congestion, rhinorrhea with clear drainage, headache, and bilateral ear congestion. She denies any fevers or GI symptoms.  Review of Systems   Other than as noted above, the patient denies any of the following symptoms: Systemic:  No fevers, chills, sweats, or myalgias. Eye:  No redness or discharge. ENT:  No ear pain, headache, nasal congestion, drainage, sinus pressure, or sore throat. Neck:  No neck pain, stiffness, or swollen glands. Lungs:  No cough, sputum production, hemoptysis, wheezing, chest tightness, shortness of breath or chest pain. GI:  No abdominal pain, nausea, vomiting or diarrhea.  [place]   Past medical history, family history, social history, meds, and allergies were reviewed. She is allergic to Percocet and lisinopril. Current meds include amlodipine, citalopram, Hyzaar, Lopressor, potassium chloride, Effexor, and Ambien. Medical problems include hypertension and depression.  Physical exam   Vital signs:  BP 143/89  Pulse 79  Temp(Src) 98.8 F (37.1 C) (Oral)  Resp 18  SpO2 95% General:  Alert and oriented.  In no distress.  Skin warm and dry. Eye:  No conjunctival injection or drainage. Lids were normal. ENT:  TMs and canals were normal, without erythema or inflammation.  Nasal mucosa was clear and uncongested, without drainage.  Mucous membranes were moist.  Pharynx was clear with no exudate or drainage.  There were no oral ulcerations or lesions. Neck:  Supple, no adenopathy, tenderness or mass. Lungs:  No respiratory distress.  Lungs were clear to auscultation, without wheezes, rales or rhonchi.  Breath sounds were clear and equal bilaterally.   Heart:  Regular rhythm, without gallops, murmers or rubs. Skin:  Clear, warm, and dry, without rash or lesions.   Labs   Results for orders placed during the hospital encounter of 05/09/13  POCT RAPID STREP A ([facility] ONLY)      Result Value Range   Streptococcus, Group A Screen (Direct) NEGATIVE  NEGATIVE    Assessment     The encounter diagnosis was Viral upper respiratory infection.  Plan    1.  Meds:  The following meds were prescribed:   Discharge Medication List as of 05/09/2013  1:36 PM    START taking these medications   Details  guaiFENesin-codeine (GUIATUSS AC) 100-10 MG/5ML syrup Take 10 mLs by mouth 4 (four) times daily as needed for cough., Starting 05/09/2013, Until Discontinued, Print    ipratropium (ATROVENT) 0.06 % nasal spray Place 2 sprays into both nostrils 4 (four) times daily., Starting 05/09/2013, Until Discontinued, Normal    !! naproxen (NAPROSYN) 500 MG tablet Take 1 tablet (500 mg total) by mouth 2 (two) times daily., Starting 05/09/2013, Until Discontinued, Normal     !! - Potential duplicate medications found. Please discuss with provider.     2.  Patient Education/Counseling:  The patient was given appropriate handouts, self care instructions, and instructed in symptomatic relief.  Instructed to get extra fluids, rest, and use a cool mist vaporizer.   3.  Follow up:  The patient was told to follow up here if no better in 3 to 4 days, or sooner if becoming worse in any way, and given some red flag symptoms such as increasing fever,  difficulty breathing, chest pain, or persistent vomiting which would prompt immediate return.  Follow up here as needed.      Harden Mo, MD 05/09/13 2004

## 2013-05-09 NOTE — Discharge Instructions (Signed)
Most upper respiratory infections are caused by viruses and do not require antibiotics.  We try to save the antibiotics for when we really need them to avoid resistance.  This does not mean that there is nothing that can be done.  Here are a few hints about things that can be done at home to get over an upper respiratory infection quicker:  Get extra sleep and extra fluids.  Get 7 to 9 hours of sleep per night and 6 to 8 glasses of water a day.  Getting extra sleep keeps the immune system from getting run down.  Most people with an upper respiratory infection are a little dehydrated.  The extra fluids also keep the secretions liquified and easier to deal with.  Also, get extra vitamin C.  4000 mg per day is the recommended dose. For the aches, headache, and fever, acetaminophen or ibuprofen are helpful.  These can be alternated every 4 hours.  People with liver disease should avoid large amounts of acetaminophen, and people with ulcer disease, gastroesophageal reflux, gastritis, congestive heart failure, chronic kidney disease, coronary artery disease and the elderly should avoid ibuprofen. For nasal congestion try Mucinex-D, or if you're having lots of sneezing or copious clear nasal drainage use Allegra-D, Claritin-D, or Zyrtec-D. People with high blood pressure can take these if their blood pressure is controlled, if not, it's best to avoid the forms with a "D" (decongestants).  You can use the plain Mucinex, Allegra, Claritin, or Zyrtec even if your blood pressure is not controlled.   A Saline nasal spray such as Ocean Spray can also help as can decongestant sprays such as Afrin, but you should not use the decongestant sprays for more than 3 or 4 days since they can be habituating.  If nasal dryness is a problem, Ayr Nasal Gel can help moisturize your nasal passages.  Breath Rite nasal strips can also offer a non-drug alternative treatment to nasal congestion, especially at night. For people with symptoms  of sinusitis, sleeping with your head elevated can be helpful.  For sinus pain, moist, hot compresses to the face may provide some relief.  Many people find that inhaling steam as in a shower or from a pot of steaming water can help. For sore throat, zinc containing lozenges such as Cold-Eze or Zicam are helpful.  Zinc helps to fight infection and has a mild astringent effect that relieves the sore, achey throat.  Hot salt water gargles (8 oz of hot water, 1/2 tsp of table salt, and a pinch of baking soda) can give relief as well as hot beverages such as hot tea. For the cough, old time remedies such as honey or honey and lemon are tried and true.  Over the counter cough syrups such as Delsym 2 tsp every 12 hours can help as well.  It has also been found recently that Aleve can help control a cough.  The dose is 1 to 2 tablets twice daily with food.  This can be combined with Delsym. (Note, if you are taking ibuprofen, you should not take Aleve as well--take one or the other.) A cool mist vaporizer will help keep your mucous membranes from drying out.   It's important when you have an upper respiratory infection not to pass the infection to others.  This involves being very careful about the following:  Frequent hand washing or use of hand sanitizer, especially after coughing, sneezing, blowing your nose or touching your face, nose or eyes. Do not  shake hands or touch anyone and try to avoid touching surfaces that other people use such as doorknobs, shopping carts, telephones and computer keyboards. Use tissues and dispose of them properly in a garbage can or ziplock bag. Cough into your sleeve. Do not let others eat or drink after you.  It's also important to recognize the signs of serious illness and get evaluated if they occur: Any respiratory infection that lasts more than 7 to 10 days.  Yellow nasal drainage and sputum are not reliable indicators of a bacterial infection, but if they last for more  than 1 week, see your doctor. Fever and sore throat can indicate strep. Fever and cough can indicate influenza or pneumonia. Any kind of severe symptom such as difficulty breathing, intractable vomiting, or severe pain should prompt you to see a doctor as soon as possible.   Your body's immune system is really the thing that will get rid of this infection.  Your immune system is comprised of 2 types of specialized cells called T cells and B cells.  T cells coordinate the array of cells in your body that engulf invading bacteria or viruses while B cells orchestrate the production of antibodies that neutralize infection.  Anything we do or any medications we give you, will just strengthen your immune system or help it clear up the infection quicker.  Here are a few helpful hints to improve your immune system to help overcome this illness or to prevent future infections:  A few vitamins can improve the health of your immune system.  That's why your diet should include plenty of fruits, vegetables, fish, nuts, and whole grains.  Vitamin A and bet-carotene can increase the cells that fight infections (T cells and B cells).  Vitamin A is abundant in dark greens and orange vegetables such as spinach, greens, sweet potatoes, and carrots.  Vitamin B6 contributes to the maturation of white blood cells, the cells that fight disease.  Foods with vitamin B6 include cold cereal and bananas.  Vitamin C is credited with preventing colds because it increases white blood cells and also prevents cellular damage.  Citrus fruits, peaches and green and red bell peppers are all hight in vitamin C.  Vitamin E is an anti-oxidant that encourages the production of natural killer cells which reject foreign invaders and B cells that produce antibodies.  Foods high in vitamin E include wheat germ, nuts and seeds.  Foods high in omega-3 fatty acids found in foods like salmon, tuna and mackerel boost your immune system and help  cells to engulf and absorb germs.  Probiotics are good bacteria that increase your T cells.  These can be found in yogurt and are available in supplements such as Culturelle or Align.  Moderate exercise increases the strength of your immune system and your ability to recover from illness.  I suggest 3 to 5 moderate intensity 30 minute workouts per week.    Sleep is another component of maintaining a strong immune system.  It enables your body to recuperate from the day's activities, stress and work.  My recommendation is to get between 7 and 9 hours of sleep per night.  If you smoke, try to quit completely or at least cut down.  Drink alcohol only in moderation if at all.  No more than 2 drinks daily for men or 1 for women.  Get a flu vaccine early in the fall or if you have not gotten one yet, once this illness has run  its course.  If you are over 65, a smoker, or an asthmatic, get a pneumococcal vaccine.  My final recommendation is to maintain a healthy weight.  Excess weight can impair the immune system by interfering with the way the immune system deals with invading viruses or bacteria.

## 2013-05-11 LAB — CULTURE, GROUP A STREP

## 2014-01-06 ENCOUNTER — Other Ambulatory Visit: Payer: Self-pay

## 2014-01-06 DIAGNOSIS — Z1231 Encounter for screening mammogram for malignant neoplasm of breast: Secondary | ICD-10-CM

## 2014-01-12 ENCOUNTER — Emergency Department (HOSPITAL_COMMUNITY): Payer: Managed Care, Other (non HMO)

## 2014-01-12 ENCOUNTER — Encounter (HOSPITAL_COMMUNITY): Payer: Self-pay | Admitting: Emergency Medicine

## 2014-01-12 ENCOUNTER — Emergency Department (HOSPITAL_COMMUNITY)
Admission: EM | Admit: 2014-01-12 | Discharge: 2014-01-12 | Disposition: A | Discharge status: Home / Self Care | Payer: Managed Care, Other (non HMO) | Attending: Emergency Medicine | Admitting: Emergency Medicine

## 2014-01-12 DIAGNOSIS — R5381 Other malaise: Secondary | ICD-10-CM | POA: Insufficient documentation

## 2014-01-12 DIAGNOSIS — Z79899 Other long term (current) drug therapy: Secondary | ICD-10-CM | POA: Diagnosis not present

## 2014-01-12 DIAGNOSIS — R42 Dizziness and giddiness: Secondary | ICD-10-CM | POA: Diagnosis not present

## 2014-01-12 DIAGNOSIS — Z853 Personal history of malignant neoplasm of breast: Secondary | ICD-10-CM | POA: Insufficient documentation

## 2014-01-12 DIAGNOSIS — M25519 Pain in unspecified shoulder: Secondary | ICD-10-CM | POA: Insufficient documentation

## 2014-01-12 DIAGNOSIS — F3289 Other specified depressive episodes: Secondary | ICD-10-CM | POA: Insufficient documentation

## 2014-01-12 DIAGNOSIS — Z791 Long term (current) use of non-steroidal anti-inflammatories (NSAID): Secondary | ICD-10-CM | POA: Diagnosis not present

## 2014-01-12 DIAGNOSIS — F329 Major depressive disorder, single episode, unspecified: Secondary | ICD-10-CM | POA: Diagnosis not present

## 2014-01-12 DIAGNOSIS — R531 Weakness: Secondary | ICD-10-CM

## 2014-01-12 DIAGNOSIS — I10 Essential (primary) hypertension: Secondary | ICD-10-CM | POA: Diagnosis not present

## 2014-01-12 DIAGNOSIS — R5383 Other fatigue: Principal | ICD-10-CM

## 2014-01-12 DIAGNOSIS — F17200 Nicotine dependence, unspecified, uncomplicated: Secondary | ICD-10-CM | POA: Diagnosis not present

## 2014-01-12 DIAGNOSIS — R0602 Shortness of breath: Secondary | ICD-10-CM | POA: Diagnosis present

## 2014-01-12 LAB — TROPONIN I: Troponin I: <0.3 ng/mL (ref <0.30)

## 2014-01-12 LAB — CBG MONITORING, ED: Glucose-Capillary: 131 mg/dL — ABNORMAL HIGH (ref 70–99)

## 2014-01-12 LAB — BASIC METABOLIC PANEL
ANION GAP: 13 (ref 5–15)
BUN: 14 mg/dL (ref 6–23)
CALCIUM: 9.5 mg/dL (ref 8.4–10.5)
CO2: 26 mEq/L (ref 19–32)
Chloride: 104 mEq/L (ref 96–112)
Creatinine, Ser: 0.7 mg/dL (ref 0.50–1.10)
Glucose, Bld: 89 mg/dL (ref 70–99)
Potassium: 3.5 mEq/L — ABNORMAL LOW (ref 3.7–5.3)
SODIUM: 143 meq/L (ref 137–147)

## 2014-01-12 LAB — I-STAT TROPONIN, ED: Troponin i, poc: 0 ng/mL (ref 0.00–0.08)

## 2014-01-12 LAB — CBC
HCT: 38.4 % (ref 36.0–46.0)
Hemoglobin: 13.1 g/dL (ref 12.0–15.0)
MCH: 33.9 pg (ref 26.0–34.0)
MCHC: 34.1 g/dL (ref 30.0–36.0)
MCV: 99.2 fL (ref 78.0–100.0)
PLATELETS: 354 10*3/uL (ref 150–400)
RBC: 3.87 MIL/uL (ref 3.87–5.11)
RDW: 12.4 % (ref 11.5–15.5)
WBC: 7.7 10*3/uL (ref 4.0–10.5)

## 2014-01-12 LAB — D-DIMER, QUANTITATIVE: D-Dimer, Quant: <0.27 ug/mL-FEU (ref 0.00–0.48)

## 2014-01-12 LAB — PRO B NATRIURETIC PEPTIDE: PRO B NATRI PEPTIDE: 19.4 pg/mL (ref 0–125)

## 2014-01-12 NOTE — ED Notes (Signed)
Transported to X-ray

## 2014-01-12 NOTE — ED Provider Notes (Signed)
CSN: [id number]     Arrival date & time 01/12/14  1156 History   First MD Initiated Contact with Patient 01/12/14 1305     Chief Complaint  Patient presents with  . Shortness of Breath  . Dizziness     (Consider location/radiation/quality/duration/timing/severity/associated sxs/prior Treatment) HPI Complains of generalized weakness, dyspnea with exertion and left shoulder pain(points to a 1 cm area of her left anterior shoulder) onset 10:15 AM today. Shoulder pain is worse with moving her shoulder improved with remaining still. Dyspnea is worse with walking. She denies cough denies fever denies chest pain. No treatment prior to coming here. No other associated symptoms Past Medical History  Diagnosis Date  . Hypertension   . Depression   . Cancer     breast   Past Surgical History  Procedure Laterality Date  . Breast lumpectomy    . Tubal ligation     No family history on file. History  Substance Use Topics  . Smoking status: Current Every Day Smoker -- 0.50 packs/day    Types: Cigarettes  . Smokeless tobacco: Not on file  . Alcohol Use: No   OB History   Grav Para Term Preterm Abortions TAB SAB Ect Mult Living                 Review of Systems  HENT: Negative.   Respiratory: Positive for shortness of breath.   Cardiovascular: Negative.   Gastrointestinal: Negative.   Musculoskeletal: Positive for arthralgias.       Left shoulder pain. Only has shoulder pain with movement of her shoulder  Skin: Negative.   Neurological: Positive for weakness.       Generalized weakness.  Psychiatric/Behavioral: Negative.   All other systems reviewed and are negative.     Allergies  Lisinopril and Percocet  Home Medications   Prior to Admission medications   Medication Sig Start Date End Date Taking? Authorizing Provider  amLODipine (NORVASC) 10 MG tablet Take 10 mg by mouth daily.    Historical Provider, MD  citalopram (CELEXA) 10 MG tablet Take 10 mg by mouth daily.     Historical Provider, MD  guaiFENesin-codeine (GUIATUSS AC) 100-10 MG/5ML syrup Take 10 mLs by mouth 4 (four) times daily as needed for cough. 05/09/13   Harden Mo, MD  HYDROcodone-acetaminophen (NORCO/VICODIN) 5-325 MG per tablet 1 to 2 tabs every 4 to 6 hours as needed for pain. 09/29/12   Harden Mo, MD  ipratropium (ATROVENT) 0.06 % nasal spray Place 2 sprays into both nostrils 4 (four) times daily. 05/09/13   Harden Mo, MD  losartan-hydrochlorothiazide (HYZAAR) 50-12.5 MG per tablet Take 1 tablet by mouth daily.    Historical Provider, MD  metoprolol (LOPRESSOR) 100 MG tablet Take 100 mg by mouth 2 (two) times daily.    Historical Provider, MD  naproxen (NAPROSYN) 500 MG tablet Take 1 tablet (500 mg total) by mouth 2 (two) times daily. 09/29/12   Harden Mo, MD  naproxen (NAPROSYN) 500 MG tablet Take 1 tablet (500 mg total) by mouth 2 (two) times daily. 05/09/13   Harden Mo, MD  nystatin-triamcinolone ointment [facility]) Apply topically 2 (two) times daily. 09/30/12   Adlih Moreno-Coll, MD  potassium chloride (KLOR-CON) 20 MEQ packet Take 20 mEq by mouth daily.    Historical Provider, MD  UNABLE TO FIND Trexamet  For migraines    Historical Provider, MD  venlafaxine (EFFEXOR) 37.5 MG tablet Take 37.5 mg by mouth daily.    Historical  Provider, MD  zolpidem (AMBIEN) 10 MG tablet Take 10 mg by mouth at bedtime as needed.    Historical Provider, MD   BP 130/79  Pulse 69  Temp(Src) 98.3 F (36.8 C) (Oral)  Resp 18  SpO2 94% Physical Exam  Nursing note and vitals reviewed. Constitutional: She appears well-developed and well-nourished.  HENT:  Head: Normocephalic and atraumatic.  Eyes: Conjunctivae are normal. Pupils are equal, round, and reactive to light.  Neck: Neck supple. No tracheal deviation present. No thyromegaly present.  Cardiovascular: Normal rate and regular rhythm.   No murmur heard. Pulmonary/Chest: Effort normal and breath sounds normal.  Abdominal: Soft. Bowel  sounds are normal. She exhibits no distension. There is no tenderness.  Musculoskeletal: Normal range of motion. She exhibits no edema and no tenderness.  Left upper extremity no swelling, redness or deformity. She is tender at left anterior shoulder full range of motion. Radial pulse 2+.  Neurological: She is alert. No cranial nerve deficit. Coordination normal.  Gait normal Romberg normal pronator drift normal  Skin: Skin is warm and dry. No rash noted.  Psychiatric: She has a normal mood and affect.    ED Course  Procedures (including critical care time) Labs Review Labs Reviewed  CBG MONITORING, ED - Abnormal; Notable for the following:    Glucose-Capillary 131 (*)    All other components within normal limits  PRO B NATRIURETIC PEPTIDE  BASIC METABOLIC PANEL  CBC  I-STAT TROPOININ, ED    Imaging Review No results found.   EKG Interpretation   Date/Time:  Sunday January 12 2014 12:17:57 EDT Ventricular Rate:  71 PR Interval:  160 QRS Duration: 84 QT Interval:  388 QTC Calculation: 421 R Axis:   68 Text Interpretation:  Normal sinus rhythm T wave abnormality, consider  inferolateral ischemia Abnormal ECG No significant change since last  tracing Confirmed by Winfred Leeds  MD, Ronette Hank [phone]) on 01/12/2014 1:05:49 PM     chest xray viewed  By me. Results for orders placed during the hospital encounter of 01/12/14  PRO B NATRIURETIC PEPTIDE      Result Value Ref Range   Pro B Natriuretic peptide (BNP) 19.4  0 - 125 pg/mL  BASIC METABOLIC PANEL      Result Value Ref Range   Sodium 143  137 - 147 mEq/L   Potassium 3.5 (*) 3.7 - 5.3 mEq/L   Chloride 104  96 - 112 mEq/L   CO2 26  19 - 32 mEq/L   Glucose, Bld 89  70 - 99 mg/dL   BUN 14  6 - 23 mg/dL   Creatinine, Ser 0.70  0.50 - 1.10 mg/dL   Calcium 9.5  8.4 - 10.5 mg/dL   GFR calc non Af Amer >90  >90 mL/min   GFR calc Af Amer >90  >90 mL/min   Anion gap 13  5 - 15  CBC      Result Value Ref Range   WBC 7.7  4.0 -  10.5 K/uL   RBC 3.87  3.87 - 5.11 MIL/uL   Hemoglobin 13.1  12.0 - 15.0 g/dL   HCT 38.4  36.0 - 46.0 %   MCV 99.2  78.0 - 100.0 fL   MCH 33.9  26.0 - 34.0 pg   MCHC 34.1  30.0 - 36.0 g/dL   RDW 12.4  11.5 - 15.5 %   Platelets 354  150 - 400 K/uL  D-DIMER, QUANTITATIVE      Result Value Ref Range  D-Dimer, Quant <0.27  0.00 - 0.48 ug/mL-FEU  TROPONIN I      Result Value Ref Range   Troponin I <0.30  <0.30 ng/mL  I-STAT TROPOININ, ED      Result Value Ref Range   Troponin i, poc 0.00  0.00 - 0.08 ng/mL   Comment 3           CBG MONITORING, ED      Result Value Ref Range   Glucose-Capillary 131 (*) 70 - 99 mg/dL   Comment 1 Documented in Chart     Comment 2 Notify RN     Dg Chest 2 View  01/12/2014   CLINICAL DATA:  Shortness of breath and dizziness.  EXAM: CHEST  2 VIEW  COMPARISON:  08/18/2007  FINDINGS: Lungs are adequately inflated without consolidation or effusion. Cardiomediastinal silhouette is within normal. There are degenerative changes of the spine.  IMPRESSION: No active cardiopulmonary disease.   Electronically Signed   By: Marin Olp M.D.   On: 01/12/2014 14:11    445 p.m. patient asymptomatic. MDM  Heart score equal 3, based on risk factors, age Final diagnoses:  None    Doubt pulmonary embulus, low pretest clinncal probablility, neg ddimerpatient's left shoulder pain is felt to be musculoskeletal in etiology. I couciled ptfor 5 minutes on smoking cessation. She is a candidate for outpatient cardiac workup Referred to [facility] Dx#1 weakness #2 dyspnea #3 left shouder pain #4 tobacco abuse      Orlie Dakin, MD 01/12/14 1651

## 2014-01-12 NOTE — ED Notes (Signed)
C/o dizziness when walking or standing,  Onset last pm. States today c/o difficulty breathing. C/o occ. Cough last several days.

## 2014-01-12 NOTE — ED Notes (Signed)
Pt states started having dizziness last nite and woke with dizziness.  Pt has no neuro deficits noted.  Pt reports sob that started one hour ago and has some pain in her left shoulder.  Pt states dizziness better with lying or sitting.

## 2014-01-12 NOTE — Discharge Instructions (Signed)
Call the [facility] heart care division in 2 days to schedule the next available appointment. Tell office staff that you were seen here and that we feel that you should have an outpatient cardiac evaluation. Get your prescription for Chantix filled, or ask your primary care physician for further help to stop smoking

## 2014-01-16 ENCOUNTER — Encounter: Payer: Self-pay | Admitting: Cardiology

## 2014-01-16 ENCOUNTER — Ambulatory Visit (INDEPENDENT_AMBULATORY_CARE_PROVIDER_SITE_OTHER): Payer: Managed Care, Other (non HMO) | Admitting: Cardiology

## 2014-01-16 VITALS — BP 122/80 | HR 79 | Ht 64.0 in | Wt 221.0 lb

## 2014-01-16 DIAGNOSIS — I10 Essential (primary) hypertension: Secondary | ICD-10-CM

## 2014-01-16 DIAGNOSIS — R079 Chest pain, unspecified: Secondary | ICD-10-CM | POA: Insufficient documentation

## 2014-01-16 DIAGNOSIS — R0602 Shortness of breath: Secondary | ICD-10-CM

## 2014-01-16 DIAGNOSIS — Z720 Tobacco use: Secondary | ICD-10-CM | POA: Insufficient documentation

## 2014-01-16 DIAGNOSIS — R0789 Other chest pain: Secondary | ICD-10-CM

## 2014-01-16 DIAGNOSIS — F17200 Nicotine dependence, unspecified, uncomplicated: Secondary | ICD-10-CM

## 2014-01-16 NOTE — Patient Instructions (Signed)
Your physician has requested that you have a lexiscan myoview. For further information please visit [email or web address]. Please follow instruction sheet, as given.  Follow up as needed 

## 2014-01-16 NOTE — Progress Notes (Signed)
Kristin Rose Date of Birth:  07/03/1960 [place] [place] [address], [place]  [address] [phone]        Fax   [phone]   History of Present Illness: This 53 year old [place] American woman is seen for the first time by me today.  She is seen at the request of the [facility] emergency room.  Her PCP is Carlos Levering, NP.  The patient was recently seen in the [facility] emergency room on 01/12/14.  She presented with atypical left-sided chest discomfort.  Workup in the emergency room showed no evidence of myocardial damage.  Her electrocardiogram showed nonspecific inferolateral T-wave changes.  She did have risk factors for ischemic heart disease.  She has a history of hypertension and she smokes one half pack of cigarettes a day.  She is not known to be diabetic although her blood sugar in the emergency room was slightly elevated.  Her family history reveals that her mother has diabetes her father has high blood pressure.  Both parents are still living The patient has a past history of breast cancer left breast in 2005.  No evidence of recurrence.  She had a chest x-ray on her emergency room visit which was normal.  Current Outpatient Prescriptions  Medication Sig Dispense Refill  . ALPRAZolam (XANAX) 0.5 MG tablet Take 0.25-0.5 mg by mouth 2 (two) times daily as needed for anxiety.      Marland Kitchen amLODipine (NORVASC) 10 MG tablet Take 10 mg by mouth every morning.       Marland Kitchen aspirin-acetaminophen-caffeine (EXCEDRIN MIGRAINE) 250-250-65 MG per tablet Take 2 tablets by mouth once.      Hendricks Limes CONTINUING MONTH PAK 1 MG tablet Take 1 mg by mouth daily.      . citalopram (CELEXA) 40 MG tablet Take 20 mg by mouth every morning.      Marland Kitchen losartan-hydrochlorothiazide (HYZAAR) 100-25 MG per tablet Take 1 tablet by mouth every morning.      . metoprolol (LOPRESSOR) 100 MG tablet Take 100 mg by mouth 2 (two) times daily.      . potassium chloride 20 MEQ/15ML (10%) SOLN Take  20 mEq by mouth every evening. Take 3 tsp by mouth every evening      . SUMAtriptan (IMITREX) 50 MG tablet Take 50 mg by mouth every 2 (two) hours as needed for migraine or headache. May repeat in 2 hours if headache persists or recurs.      . venlafaxine XR (EFFEXOR-XR) 150 MG 24 hr capsule Take 150 mg by mouth every evening.      . venlafaxine XR (EFFEXOR-XR) 75 MG 24 hr capsule Take 150 mg by mouth every evening.      . zolpidem (AMBIEN) 10 MG tablet Take 10 mg by mouth at bedtime.        No current facility-administered medications for this visit.    Allergies  Allergen Reactions  . Lisinopril Cough  . Percocet [Oxycodone-Acetaminophen]     There are no active problems to display for this patient.   History  Smoking status  . Current Every Day Smoker -- 0.50 packs/day  . Types: Cigarettes  Smokeless tobacco  . Not on file    History  Alcohol Use No    No family history on file.  Both parents are living.  Mother has diabetes and father has high blood pressure  Review of Systems: Constitutional: no fever chills diaphoresis or fatigue or change in weight.  Head and neck: no hearing loss, no epistaxis, no photophobia or visual disturbance. Respiratory: No cough, shortness of breath or wheezing. Cardiovascular: No chest pain peripheral edema, palpitations. Gastrointestinal: No abdominal distention, no abdominal pain, no change in bowel habits hematochezia or melena. Genitourinary: No dysuria, no frequency, no urgency, no nocturia. Musculoskeletal:No arthralgias, no back pain, no gait disturbance or myalgias. Neurological: No dizziness, no headaches, no numbness, no seizures, no syncope, no weakness, no tremors. Hematologic: No lymphadenopathy, no easy bruising. Psychiatric: No confusion, no hallucinations, no sleep disturbance.    Physical Exam: Filed Vitals:   01/16/14 1504  BP: 122/80  Pulse: 79  The patient appears to be in no distress.  Head and neck exam  reveals that the pupils are equal and reactive.  The extraocular movements are full.  There is no scleral icterus.  Mouth and pharynx are benign.  No lymphadenopathy.  No carotid bruits.  The jugular venous pressure is normal.  Thyroid is not enlarged or tender.  Chest is clear to percussion and auscultation.  No rales or rhonchi.  Expansion of the chest is symmetrical.  Heart reveals no abnormal lift or heave.  First and second heart sounds are normal.  There is no murmur gallop rub or click.  The abdomen is soft and nontender.  Bowel sounds are normoactive.  There is no hepatosplenomegaly or mass.  There are no abdominal bruits.  Extremities reveal no phlebitis or edema.  Pedal pulses are good.  There is no cyanosis or clubbing.  Neurologic exam is normal strength and no lateralizing weakness.  No sensory deficits.  Integument reveals no rash  EKG shows nonspecific inferolateral T-wave inversions slightly improved since previous tracing of 01/12/14  Assessment / Plan: 1. atypical left-sided chest pain with nonspecific abnormal EKG changes. 2. 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 tobacco abuse 4. past history of breast cancer of left breast 2005 with no evidence of recurrence  Plan: We will have the patient return for a Lexi scan Myoview stress test.  No new medications prescribed. Many thanks for the opportunity to see this pleasant woman.  We will be in touch regarding the results of the stress test.

## 2014-01-29 ENCOUNTER — Encounter (HOSPITAL_COMMUNITY): Payer: Managed Care, Other (non HMO)

## 2014-01-31 ENCOUNTER — Ambulatory Visit (HOSPITAL_COMMUNITY): Payer: Managed Care, Other (non HMO) | Attending: Cardiology | Admitting: Radiology

## 2014-01-31 VITALS — BP 136/96 | HR 84 | Ht 64.0 in | Wt 220.0 lb

## 2014-01-31 DIAGNOSIS — R079 Chest pain, unspecified: Secondary | ICD-10-CM | POA: Diagnosis not present

## 2014-01-31 DIAGNOSIS — R0609 Other forms of dyspnea: Secondary | ICD-10-CM | POA: Diagnosis not present

## 2014-01-31 DIAGNOSIS — R0789 Other chest pain: Secondary | ICD-10-CM

## 2014-01-31 DIAGNOSIS — R0602 Shortness of breath: Secondary | ICD-10-CM

## 2014-01-31 DIAGNOSIS — R002 Palpitations: Secondary | ICD-10-CM | POA: Diagnosis not present

## 2014-01-31 DIAGNOSIS — R42 Dizziness and giddiness: Secondary | ICD-10-CM | POA: Insufficient documentation

## 2014-01-31 DIAGNOSIS — I10 Essential (primary) hypertension: Secondary | ICD-10-CM | POA: Insufficient documentation

## 2014-01-31 DIAGNOSIS — R0989 Other specified symptoms and signs involving the circulatory and respiratory systems: Secondary | ICD-10-CM | POA: Diagnosis not present

## 2014-01-31 MED ORDER — REGADENOSON 0.4 MG/5ML IV SOLN
0.4000 mg | Freq: Once | INTRAVENOUS | Status: AC
  Administered 2014-01-31: 0.4 mg via INTRAVENOUS

## 2014-01-31 MED ORDER — TECHNETIUM TC 99M SESTAMIBI GENERIC - CARDIOLITE
11.0000 | Freq: Once | INTRAVENOUS | Status: AC | PRN
Start: 2014-01-31 — End: 2014-01-31
  Administered 2014-01-31: 11 via INTRAVENOUS

## 2014-01-31 MED ORDER — TECHNETIUM TC 99M SESTAMIBI GENERIC - CARDIOLITE
33.0000 | Freq: Once | INTRAVENOUS | Status: AC | PRN
Start: 2014-01-31 — End: 2014-01-31
  Administered 2014-01-31: 33 via INTRAVENOUS

## 2014-01-31 NOTE — Progress Notes (Signed)
[facility]UCLE[facility] [place], [place] [address] [phone]    Cardiology Nuclear Med Study  Kristin Rose is a 53 y.o. female     MRN : [id number]     DOB: 07/01/1960  Procedure Date: 01/31/2014  Nuclear Med Background Indication for Stress Test:  Evaluation for Ischemia and Abnormal EKG History:  no prior cardiac hx Cardiac Risk Factors: Hypertension  Symptoms:  Chest Pain (last date of chest discomfort was four days ago), Dizziness, DOE, Light-Headedness and Palpitations   Nuclear Pre-Procedure Caffeine/Decaff Intake:  None NPO After: 6:00pm   Lungs:  clear O2 Sat: 95% on room air. IV 0.9% NS with Angio Cath:  20g  IV Site: R Hand  IV Started by:  Ileene Hutchinson, EMT-P  Chest Size (in):  38 Cup Size: C  Height: 5\' 4"  (1.626 m)  Weight:  220 lb (99.791 kg)  BMI:  Body mass index is 37.74 kg/(m^2). Tech Comments:  Held metoprolol 24 hrs    Nuclear Med Study 1 or 2 day study: 1 day  Stress Test Type:  Lexiscan  Reading MD: Kirk Ruths, MD  Order Authorizing Provider:  Darlin Coco, MD  Resting Radionuclide: Technetium 44m Sestamibi  Resting Radionuclide Dose: 11.0 mCi   Stress Radionuclide:  Technetium 26m Sestamibi  Stress Radionuclide Dose: 33.0 mCi           Stress Protocol Rest HR: 84 Stress HR: 112  Rest BP: 136/96 Stress BP: 122/89  Exercise Time (min): n/a METS: n/a           Dose of Adenosine (mg):  n/a Dose of Lexiscan: 0.4 mg  Dose of Atropine (mg): n/a Dose of Dobutamine: n/a mcg/kg/min (at max HR)  Stress Test Technologist: Glade Lloyd, BS-ES  Nuclear Technologist:  Earl Many, CNMT     Rest Procedure:  Myocardial perfusion imaging was performed at rest 45 minutes following the intravenous administration of Technetium 61m Sestamibi. Rest ECG: NSR, PVC, inferolateral T wave changes.  Stress Procedure:  The patient received IV Lexiscan 0.4 mg over 15-seconds.  Technetium 34m Sestamibi injected at  30-seconds.  Quantitative spect images were obtained after a 45 minute delay. During the infusion of Lexiscan the patient complained of feeling weird and a headache.  These symptoms subsided in recovery.  Stress ECG: No significant change from baseline ECG  QPS Raw Data Images:  There is interference from nuclear activity from structures below the diaphragm. This does not affect the ability to read the study. Stress Images:  There is decreased uptake in the anterior wall. Rest Images:  There is decreased uptake in the anterior wall. Subtraction (SDS):  No evidence of ischemia. Transient Ischemic Dilatation (Normal <1.22):  1.07 Lung/Heart Ratio (Normal <0.45):  0.29  Quantitative Gated Spect Images QGS EDV:  90 ml QGS ESV:  34 ml  Impression Exercise Capacity:  Lexiscan with no exercise. BP Response:  Normal blood pressure response. Clinical Symptoms:  No chest pain or dyspnea. ECG Impression:  No significant ST segment change suggestive of ischemia. Comparison with Prior Nuclear Study: No previous nuclear study performed  Overall Impression:  Low risk stress nuclear study with a small, mild intensity, fixed anterior defect consistent with breast attenuation; no ischemia.  LV Ejection Fraction: 63%.  LV Wall Motion:  NL LV Function; NL Wall Motion  Kirk Ruths

## 2014-02-03 ENCOUNTER — Telehealth: Payer: Self-pay | Admitting: *Deleted

## 2014-02-03 ENCOUNTER — Ambulatory Visit
Admission: RE | Admit: 2014-02-03 | Discharge: 2014-02-03 | Disposition: A | Discharge status: Home / Self Care | Payer: Managed Care, Other (non HMO) | Source: Ambulatory Visit

## 2014-02-03 DIAGNOSIS — Z1231 Encounter for screening mammogram for malignant neoplasm of breast: Secondary | ICD-10-CM

## 2014-02-03 NOTE — Telephone Encounter (Signed)
Advised patient of results, will forward to [employer]

## 2014-02-03 NOTE — Telephone Encounter (Signed)
Message copied by Earvin Hansen on Mon Feb 03, 2014  6:05 PM ------      Message from: Darlin Coco      Created: Mon Feb 03, 2014 10:01 AM       Please report.  Stress test shows no ischemia. EF normal. CSD. Send report to Kristin Levering NP ------

## 2014-02-06 ENCOUNTER — Other Ambulatory Visit (HOSPITAL_COMMUNITY)
Admission: RE | Admit: 2014-02-06 | Discharge: 2014-02-06 | Disposition: A | Discharge status: Home / Self Care | Payer: Managed Care, Other (non HMO) | Source: Ambulatory Visit | Attending: Obstetrics and Gynecology | Admitting: Obstetrics and Gynecology

## 2014-02-06 ENCOUNTER — Other Ambulatory Visit: Payer: Self-pay | Admitting: Obstetrics and Gynecology

## 2014-02-06 DIAGNOSIS — Z01419 Encounter for gynecological examination (general) (routine) without abnormal findings: Secondary | ICD-10-CM | POA: Insufficient documentation

## 2014-02-07 LAB — CYTOLOGY - PAP

## 2014-05-09 HISTORY — PX: FRACTURE SURGERY: SHX138

## 2014-05-20 IMAGING — US US CORE BIOPSY
1 series · 10 of 10 positions shown · non-contrast
Comparison: Previous exams.

***ADDENDUM*** CREATED: 02/06/2012 [DATE]

Pathology from the right axillary lymph node revealed benign lymph
node tissue with no tumor seen. This was found to be concordant by
Dr. Kouluttaja Kellokangas. Pathology was relayed by telephone. The
patient reported tenderness at the site. Post biopsy instructions
were reviewed and her questions were answered. She was encouraged
to call The [REDACTED] for any additional
concerns. She was asked to return in 1 year for screening
mammography.
Pathology results are dictated by Cattazzo Dukpa RN, BSN on [facility]
***END ADDENDUM*** SIGNED BY: Emillio Bours, M.D.
CLINICAL DATA: Abnormal MRI, right axillary adenopathy.
ULTRASOUND GUIDED CORE BIOPSY OF THE right BREAST

[Series 2: us core biopsy · 10 of 10 slices shown]
[im 1/10]
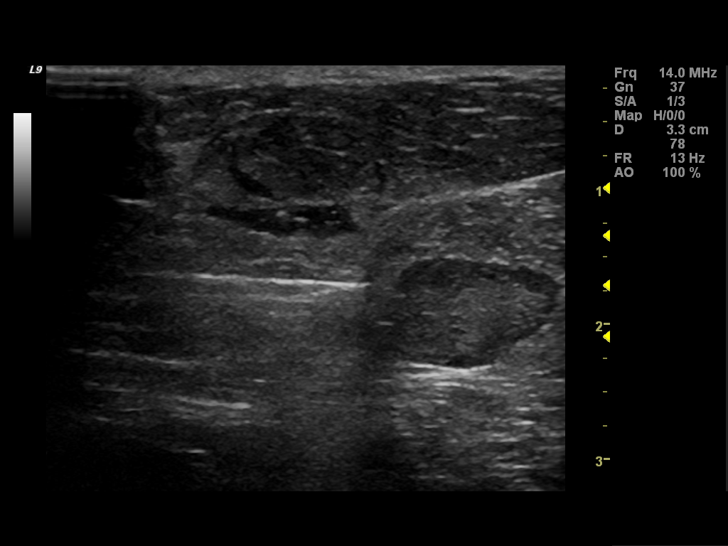
[im 2/10]
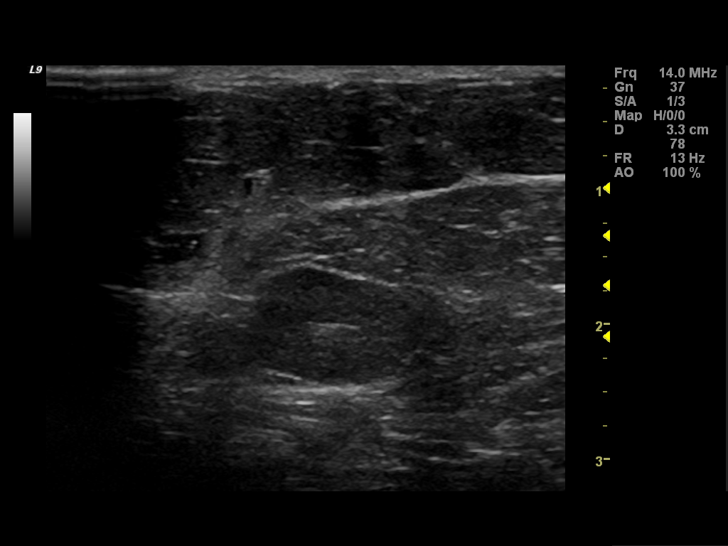
[im 3/10]
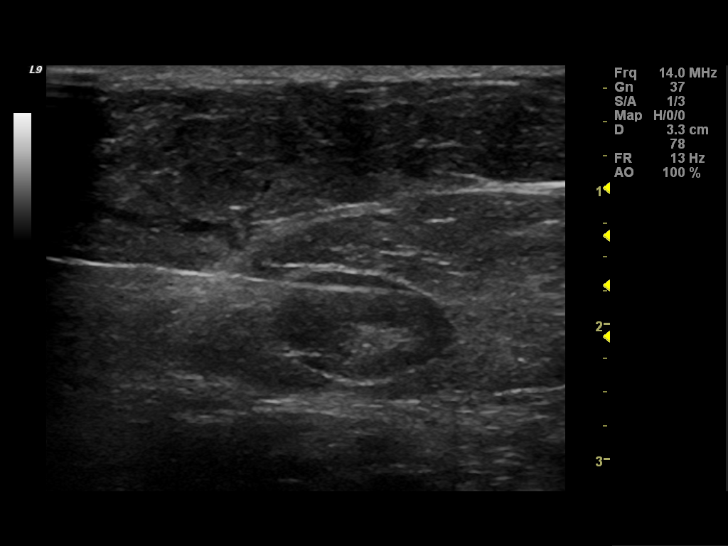
[im 4/10]
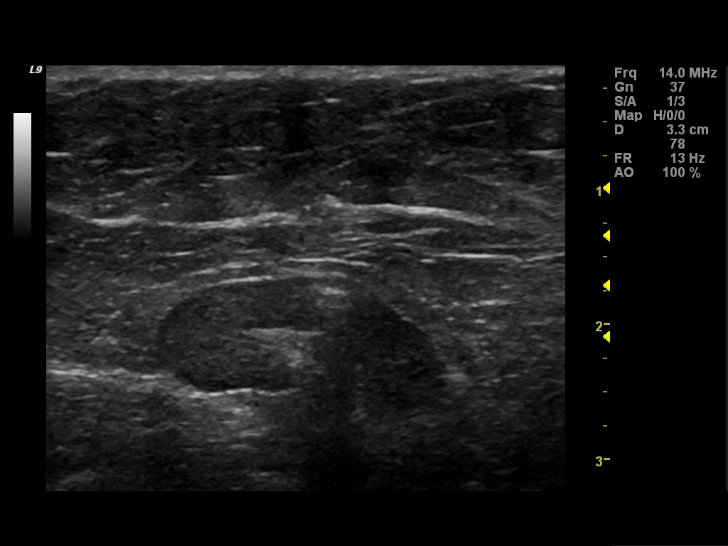
[im 5/10]
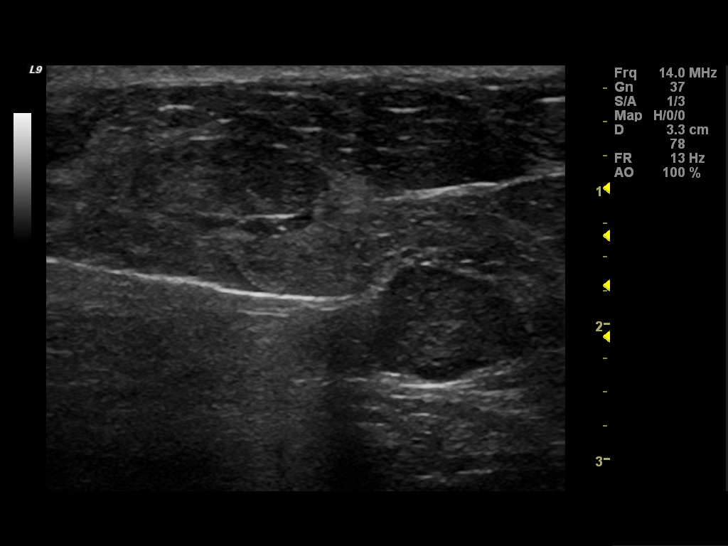
[im 6/10]
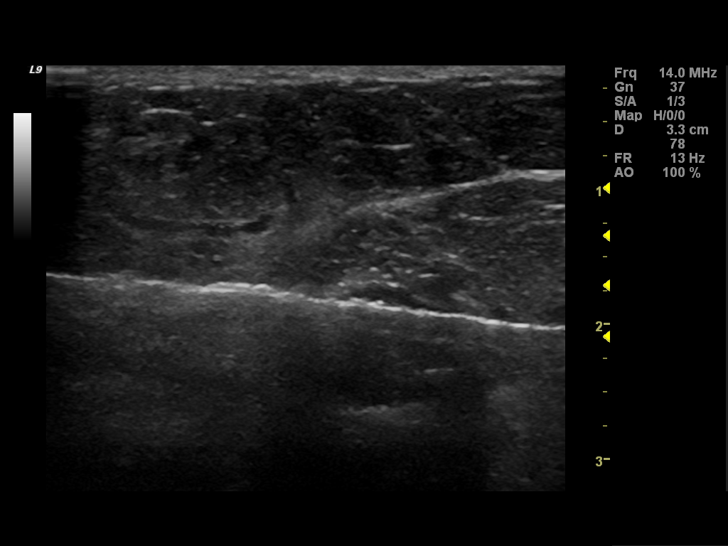
[im 7/10]
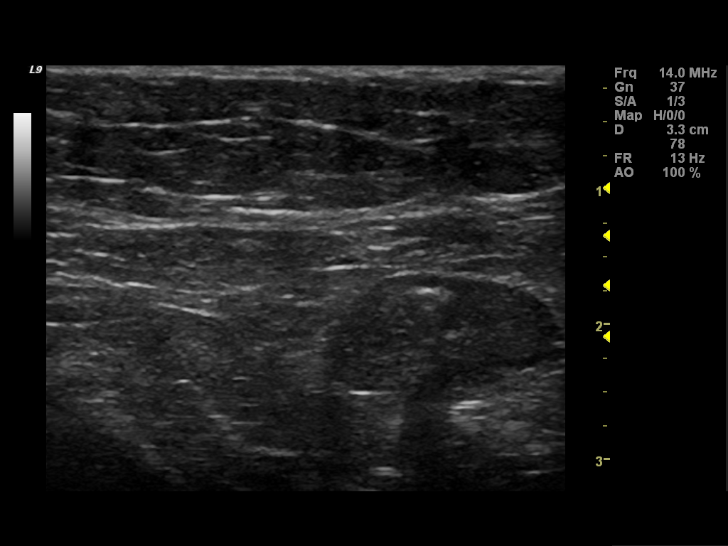
[im 8/10]
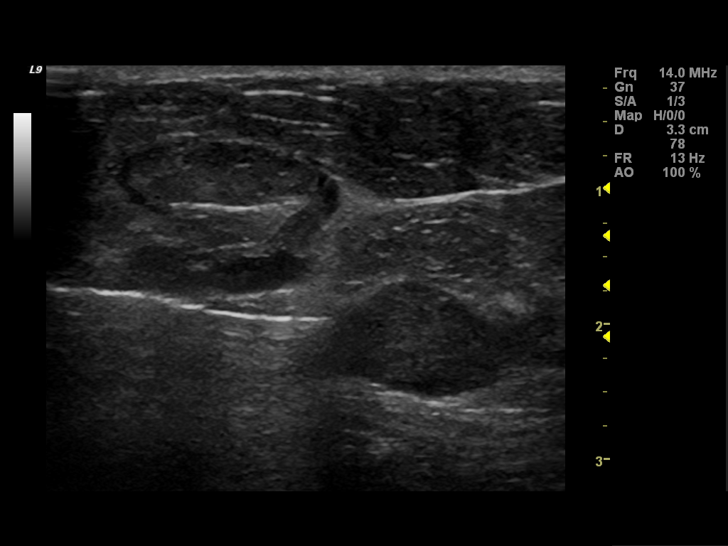
[im 9/10]
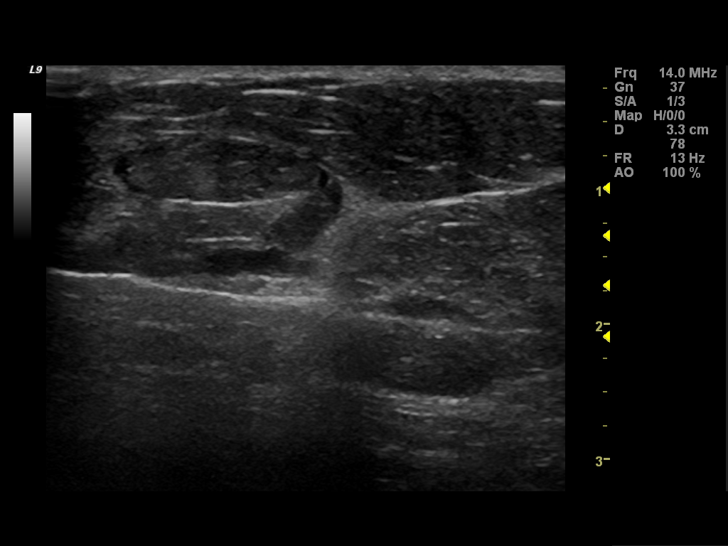
[im 10/10]
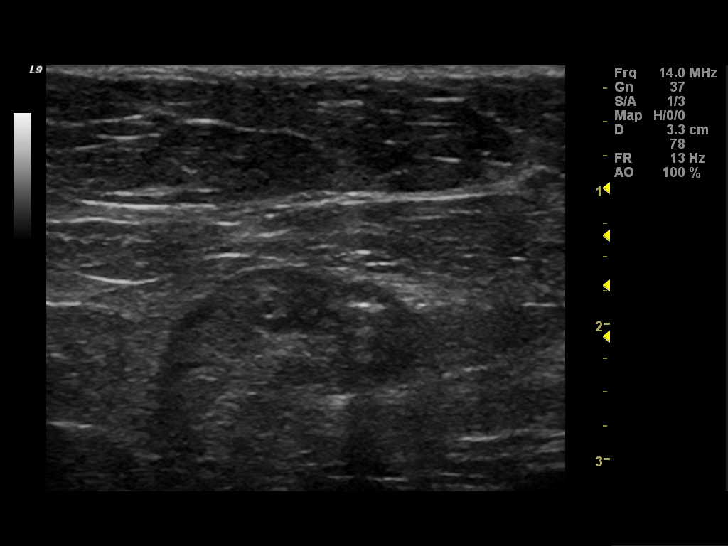

[10 of 10 positions shown; findings below may reference images not displayed]

I met with the patient and we discussed the procedure of ultrasound-
guided biopsy, including benefits and alternatives.  We discussed
the high likelihood of a successful procedure. We discussed the
risks of the procedure, including infection, bleeding, tissue
injury, and inadequate sampling.  Informed written consent was
given.

Using sterile technique 2% lidocaine, ultrasound guidance and a 14
gauge automated biopsy device, biopsy was performed of a lymph node
in the upper-outer quadrant of the right breast using a lateral
approach.
IMPRESSION: Ultrasound guided biopsy of an abnormal lymph node, right breast.
No apparent complications.

## 2014-05-21 IMAGING — US US BREAST*R*
1 series · 10 of 10 positions shown · non-contrast
Comparison: Multiple priors

CLINICAL DATA: History of left sided breast cancer and lumpectomy
in 5119.  MRI showing right axillary adenopathy.

RIGHT BREAST ULTRASOUND

[Series 1: us breast*right* · 10 of 10 slices shown]
[im 1/10]
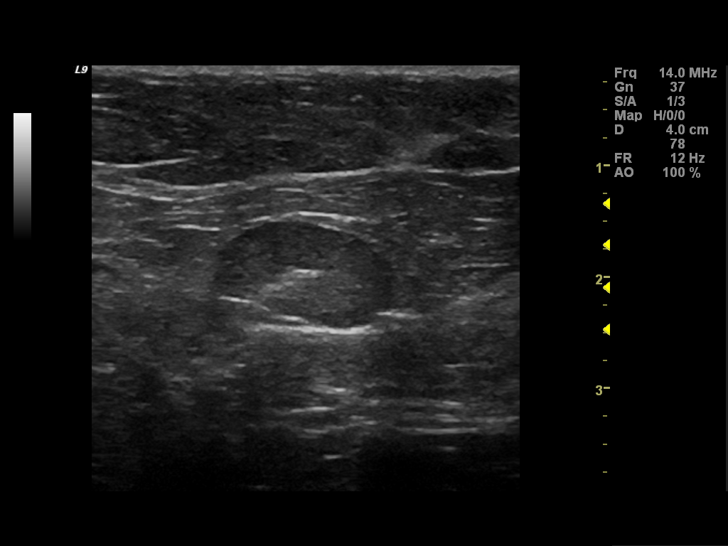
[im 2/10]
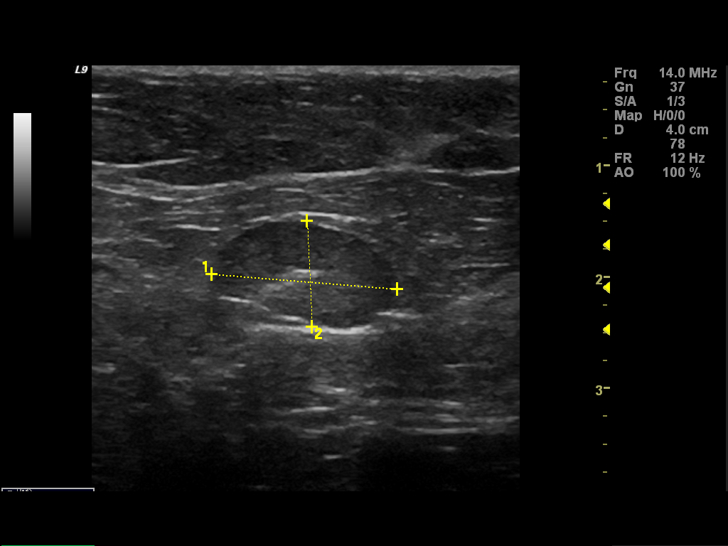
[im 3/10]
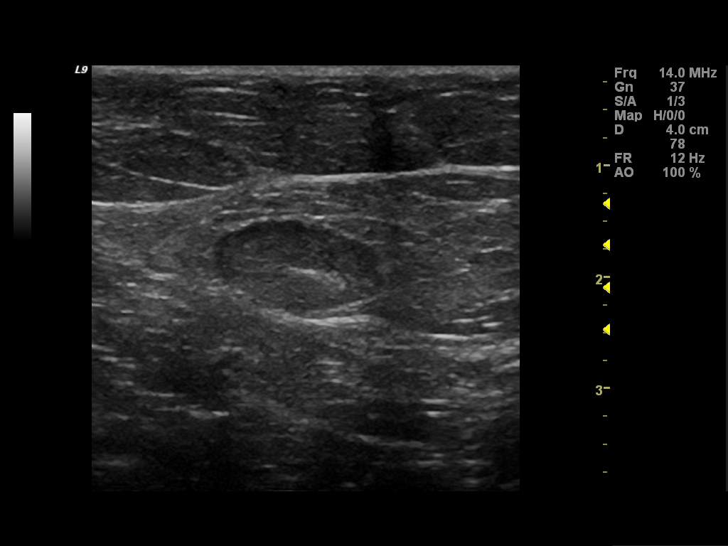
[im 4/10]
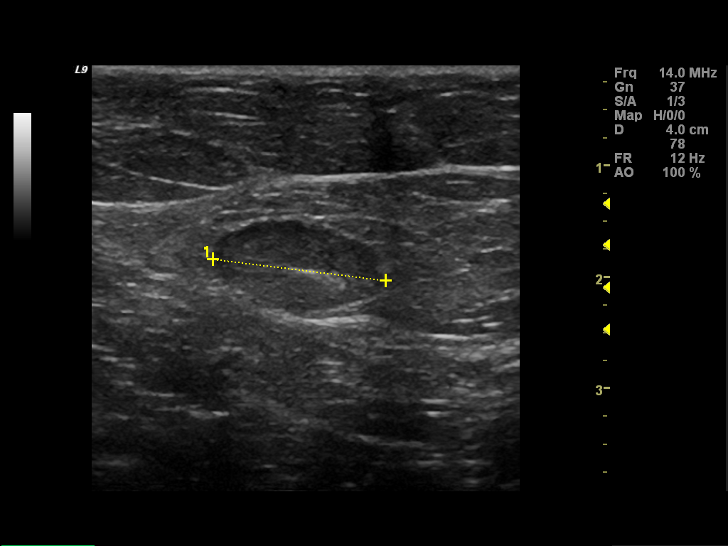
[im 5/10]
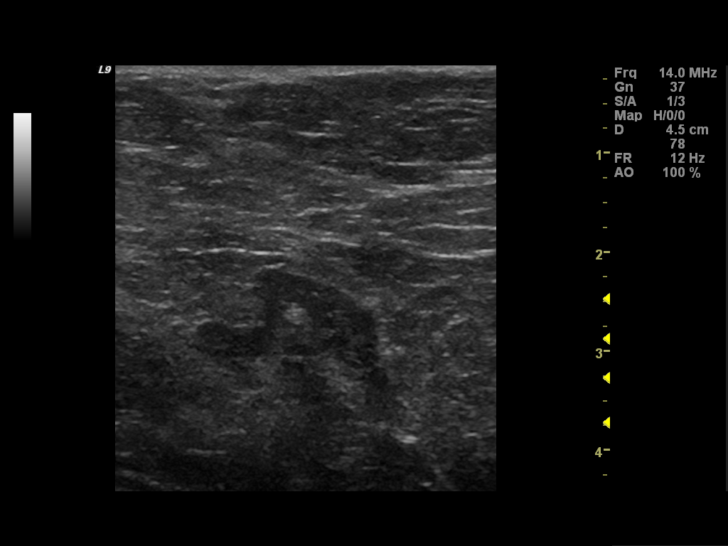
[im 6/10]
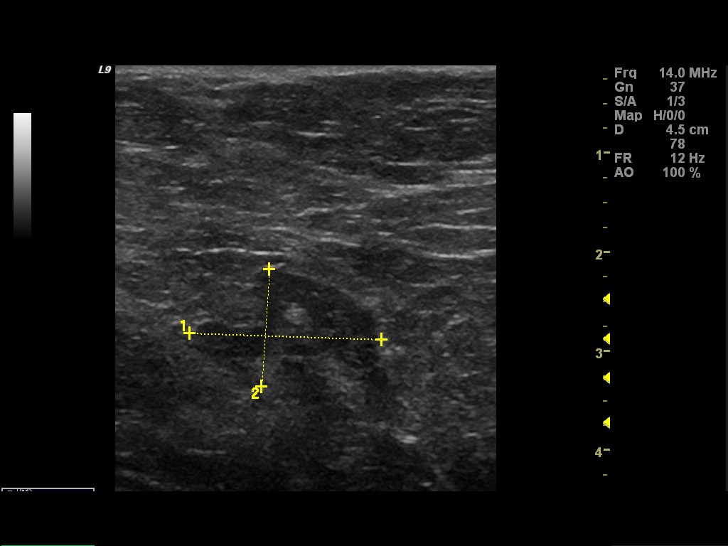
[im 7/10]
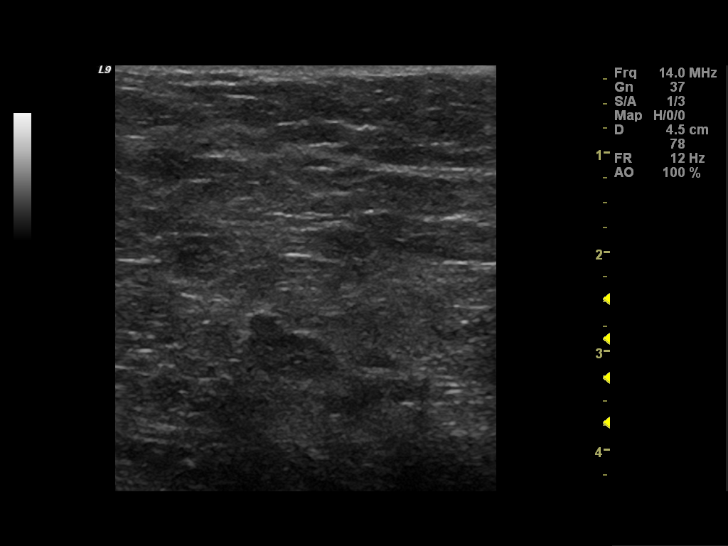
[im 8/10]
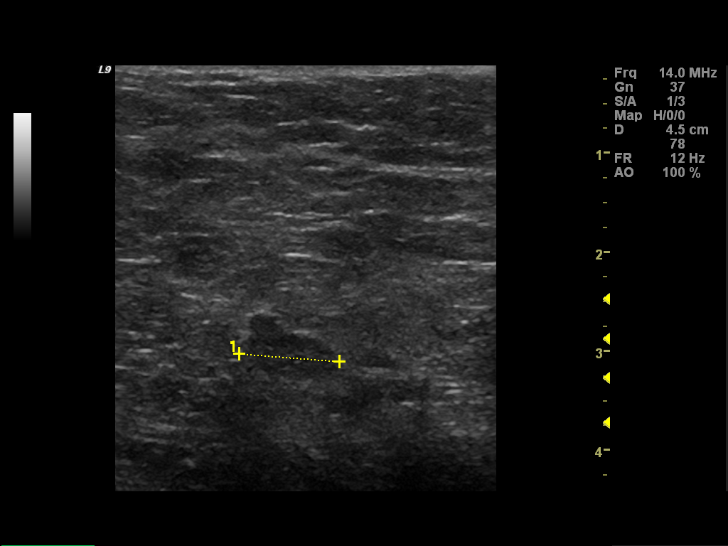
[im 9/10]
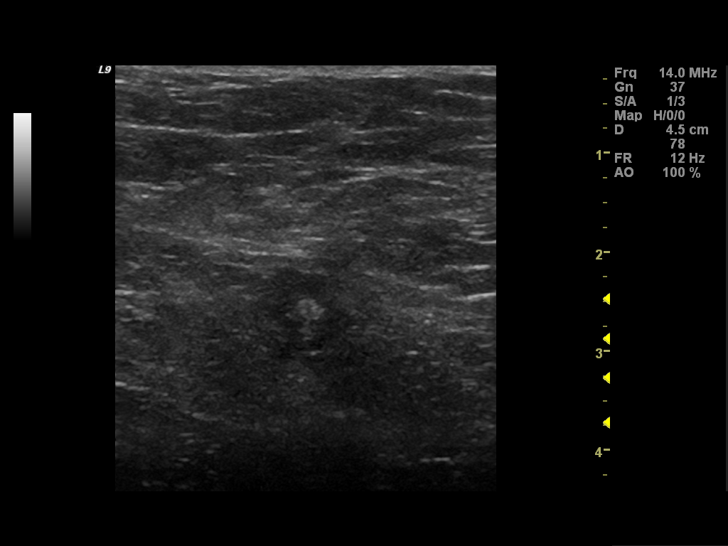
[im 10/10]
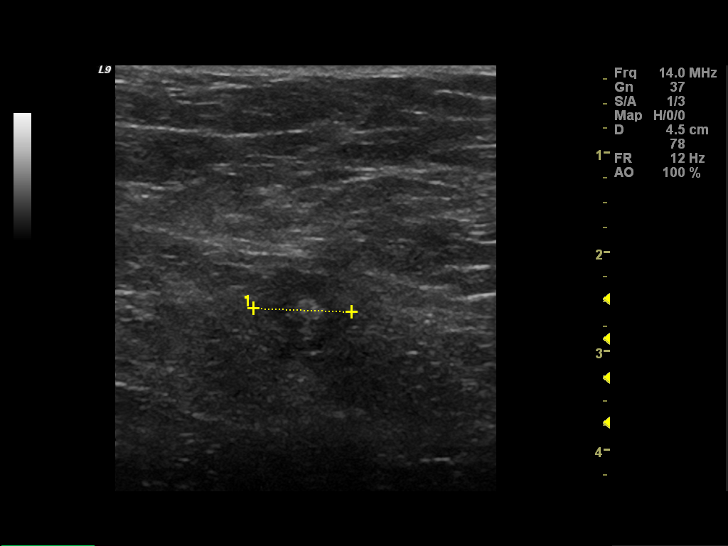

[10 of 10 positions shown; findings below may reference images not displayed]

On physical exam, I palpate fullness in the right axilla.  No
discrete mass is palpated.  Next field
FINDINGS: Ultrasound is performed, showing a lymph node in the 10
o'clock position of the right breast, 13 cm from the nipple with
diffusely thickened cortex measuring 1.7 x 1.0 x 1.6 cm.  Several
other lymph nodes with slightly thickened cortices are imaged.
IMPRESSION: Right axillary adenopathy.

RECOMMENDATION:
Ultrasound guided right axillary node biopsy.

BI-RADS CATEGORY 4:  Suspicious abnormality - biopsy should be
considered.

## 2014-11-01 ENCOUNTER — Emergency Department (HOSPITAL_COMMUNITY): Payer: Self-pay

## 2014-11-01 ENCOUNTER — Emergency Department (HOSPITAL_COMMUNITY): Payer: Managed Care, Other (non HMO)

## 2014-11-01 ENCOUNTER — Encounter (HOSPITAL_COMMUNITY): Payer: Self-pay

## 2014-11-01 ENCOUNTER — Emergency Department (HOSPITAL_COMMUNITY)
Admission: EM | Admit: 2014-11-01 | Discharge: 2014-11-01 | Disposition: A | Discharge status: Home / Self Care | Payer: Self-pay | Attending: Emergency Medicine | Admitting: Emergency Medicine

## 2014-11-01 DIAGNOSIS — Z87891 Personal history of nicotine dependence: Secondary | ICD-10-CM | POA: Insufficient documentation

## 2014-11-01 DIAGNOSIS — S82892A Other fracture of left lower leg, initial encounter for closed fracture: Secondary | ICD-10-CM

## 2014-11-01 DIAGNOSIS — S82832A Other fracture of upper and lower end of left fibula, initial encounter for closed fracture: Secondary | ICD-10-CM | POA: Insufficient documentation

## 2014-11-01 DIAGNOSIS — Y9389 Activity, other specified: Secondary | ICD-10-CM | POA: Insufficient documentation

## 2014-11-01 DIAGNOSIS — W1789XA Other fall from one level to another, initial encounter: Secondary | ICD-10-CM | POA: Insufficient documentation

## 2014-11-01 DIAGNOSIS — Y9289 Other specified places as the place of occurrence of the external cause: Secondary | ICD-10-CM | POA: Insufficient documentation

## 2014-11-01 DIAGNOSIS — I10 Essential (primary) hypertension: Secondary | ICD-10-CM | POA: Insufficient documentation

## 2014-11-01 DIAGNOSIS — Z853 Personal history of malignant neoplasm of breast: Secondary | ICD-10-CM | POA: Insufficient documentation

## 2014-11-01 DIAGNOSIS — S9302XA Subluxation of left ankle joint, initial encounter: Secondary | ICD-10-CM | POA: Insufficient documentation

## 2014-11-01 DIAGNOSIS — F329 Major depressive disorder, single episode, unspecified: Secondary | ICD-10-CM | POA: Insufficient documentation

## 2014-11-01 DIAGNOSIS — Y998 Other external cause status: Secondary | ICD-10-CM | POA: Insufficient documentation

## 2014-11-01 DIAGNOSIS — Z79899 Other long term (current) drug therapy: Secondary | ICD-10-CM | POA: Insufficient documentation

## 2014-11-01 MED ORDER — HYDROMORPHONE HCL 1 MG/ML IJ SOLN
1.0000 mg | Freq: Once | INTRAMUSCULAR | Status: AC
  Administered 2014-11-01: 1 mg via INTRAVENOUS
  Filled 2014-11-01: qty 1

## 2014-11-01 MED ORDER — BUPIVACAINE HCL 0.5 % IJ SOLN
50.0000 mL | Freq: Once | INTRAMUSCULAR | Status: DC
  Filled 2014-11-01: qty 50

## 2014-11-01 MED ORDER — LIDOCAINE-EPINEPHRINE 1 %-1:100000 IJ SOLN
20.0000 mL | Freq: Once | INTRAMUSCULAR | Status: AC
  Administered 2014-11-01: 1 mL via INTRADERMAL
  Filled 2014-11-01: qty 1

## 2014-11-01 MED ORDER — CEFAZOLIN SODIUM 1-5 GM-% IV SOLN
1.0000 g | Freq: Once | INTRAVENOUS | Status: AC
  Administered 2014-11-01: 1 g via INTRAVENOUS
  Filled 2014-11-01: qty 50

## 2014-11-01 MED ORDER — LORAZEPAM 2 MG/ML IJ SOLN
0.5000 mg | Freq: Once | INTRAMUSCULAR | Status: AC
  Administered 2014-11-01: 0.5 mg via INTRAVENOUS
  Filled 2014-11-01: qty 1

## 2014-11-01 MED ORDER — BUPIVACAINE HCL (PF) 0.5 % IJ SOLN
10.0000 mL | Freq: Once | INTRAMUSCULAR | Status: AC
  Administered 2014-11-01: 10 mL
  Filled 2014-11-01: qty 10

## 2014-11-01 MED ORDER — HYDROCODONE-ACETAMINOPHEN 5-325 MG PO TABS: 1.0000 | ORAL_TABLET | Freq: Four times a day (QID) | ORAL | Status: DC | PRN

## 2014-11-01 MED ORDER — BUPIVACAINE HCL 0.5 % IJ SOLN
50.0000 mL | Freq: Once | INTRAMUSCULAR | Status: DC
Start: 2014-11-01 — End: 2014-11-01

## 2014-11-01 MED ORDER — LIDOCAINE-EPINEPHRINE 2 %-1:100000 IJ SOLN
20.0000 mL | Freq: Once | INTRAMUSCULAR | Status: DC
  Filled 2014-11-01: qty 20

## 2014-11-01 NOTE — Discharge Instructions (Signed)
Ankle Fracture °A fracture is a break in a bone. A cast or splint may be used to protect the ankle and heal the break. Sometimes, surgery is needed. °HOME CARE °· Use crutches as told by your doctor. It is very important that you use your crutches correctly. °· Do not put weight or pressure on the injured ankle until told by your doctor. °· Keep your ankle raised (elevated) when sitting or lying down. °· Apply ice to the ankle: °¨ Put ice in a plastic bag. °¨ Place a towel between your cast and the bag. °¨ Leave the ice on for 20 minutes, 2-3 times a day. °· If you have a plaster or fiberglass cast: °¨ Do not try to scratch under the cast with any objects. °¨ Check the skin around the cast every day. You may put lotion on red or sore areas. °¨ Keep your cast dry and clean. °· If you have a plaster splint: °¨ Wear the splint as told by your doctor. °¨ You can loosen the elastic around the splint if your toes get numb, tingle, or turn cold or blue. °· Do not put pressure on any part of your cast or splint. It may break. Rest your plaster splint or cast only on a pillow the first 24 hours until it is fully hardened. °· Cover your cast or splint with a plastic bag during showers. °· Do not lower your cast or splint into water. °· Take medicine as told by your doctor. °· Do not drive until your doctor says it is safe. °· Follow-up with your doctor as told. It is very important that you go to your follow-up visits. °GET HELP IF: °The swelling and discomfort gets worse.  °GET HELP RIGHT AWAY IF:  °· Your splint or cast breaks. °· You continue to have very bad pain. °· You have new pain or swelling after your splint or cast was put on. °· Your skin or toes below the injured ankle: °¨ Turn blue or gray. °¨ Feel cold, numb, or you cannot feel them. °· There is a bad smell or yellowish white fluid (pus) coming from under the splint or cast. °MAKE SURE YOU:  °· Understand these instructions. °· Will watch your  condition. °· Will get help right away if you are not doing well or get worse. °Document Released: 02/20/2009 Document Revised: 02/13/2013 Document Reviewed: 11/22/2012 °[employer]® Patient Information ©2015 [employer]. This information is not intended to replace advice given to you by your health care provider. Make sure you discuss any questions you have with your health care provider. ° °

## 2014-11-01 NOTE — ED Notes (Signed)
To room via EMS.  Pt tripped over a cord getting out of car tripped, obvious deformity to left ankle.  Left pedal pulse weaker than right pedal pulse. EMS BP129/90 HR 72, after Fentanyl 150 mcg O2 sats dropped to 87%, was placed on O2 2L via [place], O2 sats increased to 96%

## 2014-11-01 NOTE — ED Notes (Signed)
Procedure done.

## 2014-11-01 NOTE — Progress Notes (Signed)
Orthopedic Tech Progress Note Patient Details:  Kristin Rose 02/15/1961 [id number]  Ortho Devices Type of Ortho Device: Ace wrap, Stirrup splint, Post (short leg) splint Ortho Device/Splint Location: LLE Ortho Device/Splint Interventions: Ordered, Application   Braulio Bosch 11/01/2014, 5:56 PM

## 2014-11-01 NOTE — ED Notes (Signed)
Patient transported to X-ray 

## 2014-11-01 NOTE — ED Notes (Signed)
Patient returned from CT

## 2014-11-01 NOTE — ED Notes (Signed)
With pts permission nurse may throw pants that were cut off her away in trash.

## 2014-11-01 NOTE — ED Notes (Signed)
Ortho Tech at the bedside fitting patient for crutches.

## 2014-11-01 NOTE — ED Provider Notes (Signed)
CSN: [id number]     Arrival date & time 11/01/14  1522 History   First MD Initiated Contact with Patient 11/01/14 1526     Chief Complaint  Patient presents with  . Foot Injury     (Consider location/radiation/quality/duration/timing/severity/associated sxs/prior Treatment) HPI Comments: Foot got caught in [employer] cord while getting out of car, tried to catch herself, fell twisting ankle. Ankle deformed   Patient is a 54 y.o. female presenting with foot injury. The history is provided by the patient. No language interpreter was used.  Foot Injury Location:  Foot Time since incident:  1 hour Injury: yes   Mechanism of injury: fall   Fall:    Fall occurred:  From a vehicle   Impact surface:  Hard floor   Entrapped after fall: no   Affected location: L ankle. Pain details:    Quality:  Aching   Radiates to:  Does not radiate   Severity:  Moderate   Onset quality:  Sudden   Duration:  1 hour   Timing:  Constant   Progression:  Unchanged Chronicity:  New Dislocation: yes   Foreign body present:  No foreign bodies Tetanus status:  Up to date Relieved by: fentanyl. Exacerbated by: mvmt. Ineffective treatments:  None tried Associated symptoms: decreased ROM and swelling   Associated symptoms: no back pain, no fatigue, no fever and no neck pain   Risk factors: obesity   Risk factors: no concern for non-accidental trauma, no frequent fractures, no known bone disorder and no recent illness     Past Medical History  Diagnosis Date  . Hypertension   . Depression   . Cancer     breast, left breast lumpectomy    Past Surgical History  Procedure Laterality Date  . Breast lumpectomy    . Tubal ligation     History reviewed. No pertinent family history. History  Substance Use Topics  . Smoking status: Former Smoker -- 0.00 packs/day    Quit date: 03/02/2014  . Smokeless tobacco: Not on file  . Alcohol Use: No   OB History    No data available     Review of  Systems  Constitutional: Negative for fever, chills, diaphoresis, activity change, appetite change and fatigue.  HENT: Negative for congestion, facial swelling, rhinorrhea and sore throat.   Eyes: Negative for photophobia and discharge.  Respiratory: Negative for cough, chest tightness and shortness of breath.   Cardiovascular: Negative for chest pain, palpitations and leg swelling.  Gastrointestinal: Negative for nausea, vomiting, abdominal pain and diarrhea.  Endocrine: Negative for polydipsia and polyuria.  Genitourinary: Negative for dysuria, frequency, difficulty urinating and pelvic pain.  Musculoskeletal: Negative for back pain, arthralgias, neck pain and neck stiffness.  Skin: Negative for color change and wound.  Allergic/Immunologic: Negative for immunocompromised state.  Neurological: Negative for facial asymmetry, weakness, numbness and headaches.  Hematological: Does not bruise/bleed easily.  Psychiatric/Behavioral: Negative for confusion and agitation.      Allergies  Lisinopril and Percocet  Home Medications   Prior to Admission medications   Medication Sig Start Date End Date Taking? Authorizing Provider  amLODipine (NORVASC) 10 MG tablet Take 10 mg by mouth every morning.    Yes Historical Provider, MD  aspirin-acetaminophen-caffeine (EXCEDRIN MIGRAINE) [phone] MG per tablet Take 2 tablets by mouth every 8 (eight) hours as needed for headache.    Yes Historical Provider, MD  Aspirin-Salicylamide-Caffeine (BC HEADACHE POWDER PO) Take 1 Package by mouth daily as needed (for pain).  Yes Historical Provider, MD  losartan-hydrochlorothiazide (HYZAAR) 100-25 MG per tablet Take 1 tablet by mouth every morning.   Yes Historical Provider, MD  metoprolol (LOPRESSOR) 100 MG tablet Take 100 mg by mouth 2 (two) times daily.   Yes Historical Provider, MD  omeprazole (PRILOSEC) 20 MG capsule Take 20 mg by mouth daily. 10/07/14  Yes Historical Provider, MD  SUMAtriptan (IMITREX)  50 MG tablet Take 50 mg by mouth every 2 (two) hours as needed for migraine or headache. May repeat in 2 hours if headache persists or recurs.   Yes Historical Provider, MD  venlafaxine XR (EFFEXOR-XR) 150 MG 24 hr capsule Take 150 mg by mouth every evening.   Yes Historical Provider, MD  venlafaxine XR (EFFEXOR-XR) 75 MG 24 hr capsule Take 75 mg by mouth every evening.    Yes Historical Provider, MD  zolpidem (AMBIEN) 10 MG tablet Take 10 mg by mouth at bedtime.    Yes Historical Provider, MD  HYDROcodone-acetaminophen (NORCO) 5-325 MG per tablet Take 1-2 tablets by mouth every 6 (six) hours as needed. 11/01/14   Ernestina Patches, MD   BP 104/56 mmHg  Pulse 73  Temp(Src) 98.2 F (36.8 C) (Oral)  Resp 22  SpO2 96% Physical Exam  Constitutional: She is oriented to person, place, and time. She appears well-developed and well-nourished. No distress.  HENT:  Head: Normocephalic and atraumatic.  Mouth/Throat: No oropharyngeal exudate.  Eyes: Pupils are equal, round, and reactive to light.  Neck: Normal range of motion. Neck supple.  Cardiovascular: Normal rate, regular rhythm and normal heart sounds.  Exam reveals no gallop and no friction rub.   No murmur heard. Pulmonary/Chest: Effort normal and breath sounds normal. No respiratory distress. She has no wheezes. She has no rales.  Abdominal: Soft. Bowel sounds are normal. She exhibits no distension and no mass. There is no tenderness. There is no rebound and no guarding.  Musculoskeletal: She exhibits no edema.       Left foot: There is decreased range of motion, tenderness, bony tenderness, swelling, decreased capillary refill and deformity.       Feet:  Neurological: She is alert and oriented to person, place, and time.  Skin: Skin is warm and dry.  Psychiatric: She has a normal mood and affect.    ED Course  Reduction of fracture Date/Time: 11/01/2014 5:40 PM Performed by: Ernestina Patches Authorized by: Ernestina Patches Consent: Verbal  consent obtained. Written consent not obtained. Risks and benefits: risks, benefits and alternatives were discussed Consent given by: patient Patient understanding: patient states understanding of the procedure being performed Patient consent: the patient's understanding of the procedure matches consent given Procedure consent: procedure consent matches procedure scheduled Relevant documents: relevant documents present and verified Test results: test results available and properly labeled Site marked: the operative site was marked Imaging studies: imaging studies available Required items: required blood products, implants, devices, and special equipment available Patient identity confirmed: verbally with patient Time out: Immediately prior to procedure a "time out" was called to verify the correct patient, procedure, equipment, support staff and site/side marked as required. Preparation: Patient was prepped and draped in the usual sterile fashion. Local anesthesia used: yes Anesthesia: hematoma block Local anesthetic: lidocaine 1% without epinephrine Patient sedated: yes Sedation type: anxiolysis Sedatives: lorazepam Analgesia: hydromorphone Vitals: Vital signs were monitored during sedation. Patient tolerance: Patient tolerated the procedure well with no immediate complications   (including critical care time) Labs Review Labs Reviewed - No data to display  Imaging  Review Dg Ankle Complete Left  11/01/2014   CLINICAL DATA:  Left ankle injury, post reduction  EXAM: LEFT ANKLE COMPLETE - 3+ VIEW  COMPARISON:  None.  FINDINGS: Improved alignment status post reduction. Persistent widening of the medial ankle mortise.  Distal fibular fracture, minimally displaced.  Overlying cast/splint obscures fine osseous detail.  The base of the fifth metatarsal is unremarkable.  Mild soft tissue swelling.  IMPRESSION: Improved alignment status post reduction. Persist widening of the medial ankle  mortise.  Distal fibular fracture, minimally displaced.   Electronically Signed   By: Julian Hy M.D.   On: 11/01/2014 19:07   Dg Ankle Complete Left  11/01/2014   CLINICAL DATA:  Left ankle deformity.  Fall.  EXAM: LEFT ANKLE COMPLETE - 3+ VIEW  COMPARISON:  None.  FINDINGS: There is a fracture dislocation involving the ankle. There is been complete fracture of the distal fibula. Medial dislocation of the tibiotalar joint is identified. Posterior angulation of the talus and distal fibular fracture fragment noted.  IMPRESSION: 1. Complex fracture dislocation of the tibiotalar joint and distal fibula.   Electronically Signed   By: Kerby Moors M.D.   On: 11/01/2014 16:05   Ct Ankle Left Wo Contrast  11/01/2014   CLINICAL DATA:  Left ankle fracture dislocation, status post reduction.  EXAM: CT OF THE LEFT ANKLE WITHOUT CONTRAST  TECHNIQUE: Multidetector CT imaging of the left ankle was performed according to the standard protocol. Multiplanar CT image reconstructions were also generated.  COMPARISON:  11/01/2014  FINDINGS: Oblique fracture of the lateral malleolus observed, minimal comminution.  The tibiotalar component of the deltoid ligament is ruptured, and the distance between the medial malleolus and the talus is 7 mm as compared to the craniocaudad tibiotalar distance of 4 mm. There several bony fragments interposed between the medial malleolus and talus which probably originating from the medial malleolus posteriorly. The distal fibular fracture fragment is still displaced about 4 mm lateral to the proximal shaft fragment, and the proximal shaft fragment extends slightly into the tibiotalar space as shown on image 32 series 5. On image 29 series 5 we observe a small triangular fracture likely originating from the fibula interposed in the anteromedial tibiotalar space.  I do not observe fractures of the talus, calcaneus, cuboid, navicular, cuneiforms, or of the bases of the metatarsals. No  malalignment at the Lisfranc joint. There is certainly edema in the sinus tarsi but this may be incidental. No obvious disruption of the Achilles tendon or plantar fascia.  No flexor tendon entrapment. Small amount of gas in the subcutaneous tissues superficial to the extensor digitorum longus, image 44 series 4.  IMPRESSION: 1. Comminuted oblique lateral malleolar fracture with avulsion/tear of the tibiotalar component of the deltoid ligament, allowing for 3 additional mm of abnormal medial subluxation of the tibia with respect to the talus, and mild interposition of the fractured indents the proximal fragment of the distal fibula into the tibiotalar space. There is small bony fragments interposed between the medial malleolus and the talus, likely representing avulsion fragments. We do not demonstrate tendon entrapment nor are there other fractures evident.   Electronically Signed   By: Van Clines M.D.   On: 11/01/2014 20:22     EKG Interpretation None      MDM   Final diagnoses:  Fracture dislocation of ankle joint, left, closed, initial encounter    Pt is a 54 y.o. female with Pmhx as above who presents with L ankle deformity after foot  got twisted in her GPS cord and she fell getting out of her car. Tetanus UTD. Motor and sensation are intact distally, pulse 2+ unaffected foot, 1+ affected foot. No other obvious injury. She has superficial abrasion over medial mallolus, tenting of skin. 1g IV ancef given though fx not obviously open. Spoke w/ Dr. Erlinda Hong or ortho, will attempt reduction in ED, will get CT afterwards and will splint.   XR shows complex fx/dislocation which was reduced by Myself and D.r Cardama using hematoma block, IV dilaudid/ativan and splinted. DP pulse remains 1+. Per Dr. Marchia Bond, pt will call office Monday morning for appt that day, stay in bed, NWB with foot elevated.    Carlyle Basques evaluation in the Emergency Department is complete. It has been determined that no  acute conditions requiring further emergency intervention are present at this time. The patient/guardian have been advised of the diagnosis and plan. We have discussed signs and symptoms that warrant return to the ED, such as changes or worsening in symptoms, worsening pain, discoloration, numbness.       Ernestina Patches, MD 11/02/14 [phone]

## 2014-11-01 NOTE — ED Notes (Signed)
MD and resident in room.  Marcaine injected into left ankle area.

## 2014-11-03 ENCOUNTER — Other Ambulatory Visit (HOSPITAL_COMMUNITY): Payer: Self-pay | Admitting: Orthopaedic Surgery

## 2014-11-04 ENCOUNTER — Encounter (HOSPITAL_COMMUNITY): Payer: Self-pay | Admitting: *Deleted

## 2014-11-04 NOTE — Progress Notes (Signed)
Pt denies any recent chest pain or sob. Had normal stress test in October, 2015.

## 2014-11-04 NOTE — Progress Notes (Signed)
   11/04/14 0930  OBSTRUCTIVE SLEEP APNEA  Have you ever been diagnosed with sleep apnea through a sleep study? No  Do you snore loudly (loud enough to be heard through closed doors)?  1  Do you often feel tired, fatigued, or sleepy during the daytime? 1  Has anyone observed you stop breathing during your sleep? 0  Do you have, or are you being treated for high blood pressure? 1  BMI more than 35 kg/m2? 1  Age over 54 years old? 1  Gender: 0

## 2014-11-05 ENCOUNTER — Observation Stay (HOSPITAL_COMMUNITY): Payer: Managed Care, Other (non HMO)

## 2014-11-05 ENCOUNTER — Observation Stay (HOSPITAL_COMMUNITY)
Admission: RE | Admit: 2014-11-05 | Discharge: 2014-11-06 | Disposition: A | Discharge status: Home / Self Care | Payer: Self-pay | Source: Ambulatory Visit | Attending: Orthopaedic Surgery | Admitting: Orthopaedic Surgery

## 2014-11-05 ENCOUNTER — Encounter (HOSPITAL_COMMUNITY): Payer: Self-pay | Admitting: *Deleted

## 2014-11-05 ENCOUNTER — Encounter (HOSPITAL_COMMUNITY)
Admission: RE | Disposition: A | Discharge status: Home / Self Care | Payer: Self-pay | Source: Ambulatory Visit | Attending: Orthopaedic Surgery

## 2014-11-05 ENCOUNTER — Ambulatory Visit (HOSPITAL_COMMUNITY): Payer: Self-pay | Admitting: Certified Registered"

## 2014-11-05 DIAGNOSIS — S8262XA Displaced fracture of lateral malleolus of left fibula, initial encounter for closed fracture: Principal | ICD-10-CM | POA: Insufficient documentation

## 2014-11-05 DIAGNOSIS — Z419 Encounter for procedure for purposes other than remedying health state, unspecified: Secondary | ICD-10-CM

## 2014-11-05 DIAGNOSIS — Y9389 Activity, other specified: Secondary | ICD-10-CM | POA: Insufficient documentation

## 2014-11-05 DIAGNOSIS — Z87891 Personal history of nicotine dependence: Secondary | ICD-10-CM | POA: Insufficient documentation

## 2014-11-05 DIAGNOSIS — Y998 Other external cause status: Secondary | ICD-10-CM | POA: Insufficient documentation

## 2014-11-05 DIAGNOSIS — Y9289 Other specified places as the place of occurrence of the external cause: Secondary | ICD-10-CM | POA: Insufficient documentation

## 2014-11-05 DIAGNOSIS — W19XXXA Unspecified fall, initial encounter: Secondary | ICD-10-CM | POA: Insufficient documentation

## 2014-11-05 DIAGNOSIS — Z7982 Long term (current) use of aspirin: Secondary | ICD-10-CM | POA: Insufficient documentation

## 2014-11-05 DIAGNOSIS — Z9889 Other specified postprocedural states: Secondary | ICD-10-CM

## 2014-11-05 DIAGNOSIS — I10 Essential (primary) hypertension: Secondary | ICD-10-CM | POA: Insufficient documentation

## 2014-11-05 DIAGNOSIS — Z8781 Personal history of (healed) traumatic fracture: Secondary | ICD-10-CM

## 2014-11-05 DIAGNOSIS — Z6841 Body Mass Index (BMI) 40.0 and over, adult: Secondary | ICD-10-CM | POA: Insufficient documentation

## 2014-11-05 DIAGNOSIS — G43909 Migraine, unspecified, not intractable, without status migrainosus: Secondary | ICD-10-CM | POA: Insufficient documentation

## 2014-11-05 DIAGNOSIS — K219 Gastro-esophageal reflux disease without esophagitis: Secondary | ICD-10-CM | POA: Insufficient documentation

## 2014-11-05 DIAGNOSIS — Z853 Personal history of malignant neoplasm of breast: Secondary | ICD-10-CM | POA: Insufficient documentation

## 2014-11-05 DIAGNOSIS — F329 Major depressive disorder, single episode, unspecified: Secondary | ICD-10-CM | POA: Insufficient documentation

## 2014-11-05 HISTORY — DX: Malignant neoplasm of unspecified site of left female breast: C50.912

## 2014-11-05 HISTORY — DX: Migraine, unspecified, not intractable, without status migrainosus: G43.909

## 2014-11-05 HISTORY — DX: Cardiac murmur, unspecified: R01.1

## 2014-11-05 HISTORY — DX: Gastro-esophageal reflux disease without esophagitis: K21.9

## 2014-11-05 HISTORY — PX: ORIF ANKLE FRACTURE: SHX5408

## 2014-11-05 LAB — BASIC METABOLIC PANEL
Anion gap: 10 (ref 5–15)
BUN: 13 mg/dL (ref 6–20)
CO2: 28 mmol/L (ref 22–32)
Calcium: 9.6 mg/dL (ref 8.9–10.3)
Chloride: 102 mmol/L (ref 101–111)
Creatinine, Ser: 0.82 mg/dL (ref 0.44–1.00)
GFR calc Af Amer: >60 mL/min (ref >60)
GLUCOSE: 137 mg/dL — AB (ref 65–99)
Potassium: 3.7 mmol/L (ref 3.5–5.1)
SODIUM: 140 mmol/L (ref 135–145)

## 2014-11-05 LAB — CBC
HEMATOCRIT: 39 % (ref 36.0–46.0)
Hemoglobin: 13.1 g/dL (ref 12.0–15.0)
MCH: 32.9 pg (ref 26.0–34.0)
MCHC: 33.6 g/dL (ref 30.0–36.0)
MCV: 98 fL (ref 78.0–100.0)
Platelets: 415 10*3/uL — ABNORMAL HIGH (ref 150–400)
RBC: 3.98 MIL/uL (ref 3.87–5.11)
RDW: 12.3 % (ref 11.5–15.5)
WBC: 7.5 10*3/uL (ref 4.0–10.5)

## 2014-11-05 SURGERY — OPEN REDUCTION INTERNAL FIXATION (ORIF) ANKLE FRACTURE
Anesthesia: Regional | Site: Ankle | Laterality: Left

## 2014-11-05 MED ORDER — METOCLOPRAMIDE HCL 5 MG/ML IJ SOLN: 5.0000 mg | Freq: Three times a day (TID) | INTRAMUSCULAR | Status: DC | PRN

## 2014-11-05 MED ORDER — METHOCARBAMOL 1000 MG/10ML IJ SOLN
500.0000 mg | Freq: Four times a day (QID) | INTRAVENOUS | Status: DC | PRN
  Filled 2014-11-05: qty 5

## 2014-11-05 MED ORDER — HYDROCODONE-ACETAMINOPHEN 7.5-325 MG PO TABS: 1.0000 | ORAL_TABLET | Freq: Four times a day (QID) | ORAL | Status: DC | PRN

## 2014-11-05 MED ORDER — MUPIROCIN 2 % EX OINT: 1.0000 "application " | TOPICAL_OINTMENT | Freq: Once | CUTANEOUS | Status: DC

## 2014-11-05 MED ORDER — ONDANSETRON HCL 4 MG PO TABS: 4.0000 mg | ORAL_TABLET | Freq: Four times a day (QID) | ORAL | Status: DC | PRN

## 2014-11-05 MED ORDER — FENTANYL CITRATE (PF) 100 MCG/2ML IJ SOLN
INTRAMUSCULAR | Status: DC | PRN
  Administered 2014-11-05 (×2): 50 ug via INTRAVENOUS

## 2014-11-05 MED ORDER — ZOLPIDEM TARTRATE 5 MG PO TABS
10.0000 mg | ORAL_TABLET | Freq: Every day | ORAL | Status: DC
  Administered 2014-11-05: 10 mg via ORAL
  Filled 2014-11-05: qty 2

## 2014-11-05 MED ORDER — ONDANSETRON HCL 4 MG/2ML IJ SOLN: 4.0000 mg | Freq: Four times a day (QID) | INTRAMUSCULAR | Status: DC | PRN

## 2014-11-05 MED ORDER — POLYETHYLENE GLYCOL 3350 17 G PO PACK: 17.0000 g | PACK | Freq: Every day | ORAL | Status: DC | PRN

## 2014-11-05 MED ORDER — SUMATRIPTAN SUCCINATE 50 MG PO TABS
50.0000 mg | ORAL_TABLET | ORAL | Status: DC | PRN
  Filled 2014-11-05: qty 1

## 2014-11-05 MED ORDER — MIDAZOLAM HCL 5 MG/5ML IJ SOLN
INTRAMUSCULAR | Status: DC | PRN
  Administered 2014-11-05: 2 mg via INTRAVENOUS

## 2014-11-05 MED ORDER — LIDOCAINE HCL (CARDIAC) 20 MG/ML IV SOLN
INTRAVENOUS | Status: DC | PRN
  Administered 2014-11-05: 60 mg via INTRAVENOUS

## 2014-11-05 MED ORDER — ACETAMINOPHEN 325 MG PO TABS: 650.0000 mg | ORAL_TABLET | Freq: Four times a day (QID) | ORAL | Status: DC | PRN

## 2014-11-05 MED ORDER — PROPOFOL 10 MG/ML IV BOLUS
INTRAVENOUS | Status: DC | PRN
  Administered 2014-11-05: 160 mg via INTRAVENOUS

## 2014-11-05 MED ORDER — 0.9 % SODIUM CHLORIDE (POUR BTL) OPTIME
TOPICAL | Status: DC | PRN
  Administered 2014-11-05: 1000 mL

## 2014-11-05 MED ORDER — VENLAFAXINE HCL ER 75 MG PO CP24: 75.0000 mg | ORAL_CAPSULE | Freq: Every day | ORAL | Status: DC

## 2014-11-05 MED ORDER — MIDAZOLAM HCL 2 MG/2ML IJ SOLN
INTRAMUSCULAR | Status: AC
  Filled 2014-11-05: qty 2

## 2014-11-05 MED ORDER — EPHEDRINE SULFATE 50 MG/ML IJ SOLN
INTRAMUSCULAR | Status: DC | PRN
  Administered 2014-11-05: 10 mg via INTRAVENOUS

## 2014-11-05 MED ORDER — PROPOFOL 10 MG/ML IV BOLUS
INTRAVENOUS | Status: AC
  Filled 2014-11-05: qty 20

## 2014-11-05 MED ORDER — LOSARTAN POTASSIUM-HCTZ 100-25 MG PO TABS: 1.0000 | ORAL_TABLET | Freq: Every morning | ORAL | Status: DC

## 2014-11-05 MED ORDER — METOCLOPRAMIDE HCL 5 MG PO TABS
5.0000 mg | ORAL_TABLET | Freq: Three times a day (TID) | ORAL | Status: DC | PRN
Start: 2014-11-05 — End: 2014-11-06

## 2014-11-05 MED ORDER — MIDAZOLAM HCL 5 MG/5ML IJ SOLN: INTRAMUSCULAR | Status: DC | PRN

## 2014-11-05 MED ORDER — DEXTROSE 5 % IV SOLN
500.0000 mg | INTRAVENOUS | Status: DC
  Filled 2014-11-05: qty 5

## 2014-11-05 MED ORDER — HYDROCODONE-ACETAMINOPHEN 7.5-325 MG PO TABS
1.0000 | ORAL_TABLET | ORAL | Status: DC | PRN
  Administered 2014-11-06 (×2): 2 via ORAL
  Filled 2014-11-05 (×2): qty 2

## 2014-11-05 MED ORDER — PHENYLEPHRINE HCL 10 MG/ML IJ SOLN
10.0000 mg | INTRAVENOUS | Status: DC | PRN
  Administered 2014-11-05: 50 ug/min via INTRAVENOUS

## 2014-11-05 MED ORDER — MORPHINE SULFATE 2 MG/ML IJ SOLN: 1.0000 mg | INTRAMUSCULAR | Status: DC | PRN

## 2014-11-05 MED ORDER — HYDROCHLOROTHIAZIDE 25 MG PO TABS
25.0000 mg | ORAL_TABLET | Freq: Every day | ORAL | Status: DC
  Administered 2014-11-06: 25 mg via ORAL
  Filled 2014-11-05: qty 1

## 2014-11-05 MED ORDER — SODIUM CHLORIDE 0.9 % IV SOLN
INTRAVENOUS | Status: DC
  Administered 2014-11-05: 17:00:00 via INTRAVENOUS

## 2014-11-05 MED ORDER — VENLAFAXINE HCL ER 75 MG PO CP24
225.0000 mg | ORAL_CAPSULE | Freq: Every day | ORAL | Status: DC
  Administered 2014-11-06: 225 mg via ORAL
  Filled 2014-11-05: qty 3

## 2014-11-05 MED ORDER — VENLAFAXINE HCL ER 75 MG PO CP24: 150.0000 mg | ORAL_CAPSULE | Freq: Every day | ORAL | Status: DC

## 2014-11-05 MED ORDER — LOSARTAN POTASSIUM 50 MG PO TABS
100.0000 mg | ORAL_TABLET | Freq: Every day | ORAL | Status: DC
  Administered 2014-11-06: 100 mg via ORAL
  Filled 2014-11-05: qty 2

## 2014-11-05 MED ORDER — METOPROLOL TARTRATE 100 MG PO TABS
100.0000 mg | ORAL_TABLET | Freq: Two times a day (BID) | ORAL | Status: DC
  Administered 2014-11-05 – 2014-11-06 (×2): 100 mg via ORAL
  Filled 2014-11-05 (×2): qty 1

## 2014-11-05 MED ORDER — BUPIVACAINE-EPINEPHRINE (PF) 0.5% -1:200000 IJ SOLN
INTRAMUSCULAR | Status: DC | PRN
  Administered 2014-11-05: 30 mL via PERINEURAL
  Administered 2014-11-05: 15 mL via PERINEURAL

## 2014-11-05 MED ORDER — PHENYLEPHRINE HCL 10 MG/ML IJ SOLN
INTRAMUSCULAR | Status: DC | PRN
  Administered 2014-11-05: 80 ug via INTRAVENOUS
  Administered 2014-11-05: 40 ug via INTRAVENOUS
  Administered 2014-11-05: 80 ug via INTRAVENOUS

## 2014-11-05 MED ORDER — FENTANYL CITRATE (PF) 250 MCG/5ML IJ SOLN
INTRAMUSCULAR | Status: AC
  Filled 2014-11-05: qty 5

## 2014-11-05 MED ORDER — ACETAMINOPHEN 650 MG RE SUPP: 650.0000 mg | Freq: Four times a day (QID) | RECTAL | Status: DC | PRN

## 2014-11-05 MED ORDER — DIPHENHYDRAMINE HCL 12.5 MG/5ML PO ELIX: 25.0000 mg | ORAL_SOLUTION | ORAL | Status: DC | PRN

## 2014-11-05 MED ORDER — ZOLPIDEM TARTRATE 5 MG PO TABS: 5.0000 mg | ORAL_TABLET | Freq: Every day | ORAL | Status: DC

## 2014-11-05 MED ORDER — CEFAZOLIN SODIUM-DEXTROSE 2-3 GM-% IV SOLR
2.0000 g | Freq: Four times a day (QID) | INTRAVENOUS | Status: AC
  Administered 2014-11-05 – 2014-11-06 (×3): 2 g via INTRAVENOUS
  Filled 2014-11-05 (×3): qty 50

## 2014-11-05 MED ORDER — AMLODIPINE BESYLATE 10 MG PO TABS
10.0000 mg | ORAL_TABLET | Freq: Every morning | ORAL | Status: DC
  Administered 2014-11-06: 10 mg via ORAL
  Filled 2014-11-05: qty 1

## 2014-11-05 MED ORDER — ASPIRIN EC 325 MG PO TBEC
325.0000 mg | DELAYED_RELEASE_TABLET | Freq: Two times a day (BID) | ORAL | Status: DC
  Administered 2014-11-05 – 2014-11-06 (×2): 325 mg via ORAL
  Filled 2014-11-05 (×2): qty 1

## 2014-11-05 MED ORDER — ASPIRIN EC 325 MG PO TBEC: 325.0000 mg | DELAYED_RELEASE_TABLET | Freq: Two times a day (BID) | ORAL | Status: DC

## 2014-11-05 MED ORDER — LACTATED RINGERS IV SOLN
INTRAVENOUS | Status: DC
  Administered 2014-11-05 (×2): via INTRAVENOUS

## 2014-11-05 MED ORDER — CEFAZOLIN SODIUM-DEXTROSE 2-3 GM-% IV SOLR
2.0000 g | INTRAVENOUS | Status: AC
  Administered 2014-11-05: 2 g via INTRAVENOUS
  Filled 2014-11-05: qty 50

## 2014-11-05 MED ORDER — METHOCARBAMOL 500 MG PO TABS
500.0000 mg | ORAL_TABLET | Freq: Four times a day (QID) | ORAL | Status: DC | PRN
  Administered 2014-11-06: 500 mg via ORAL
  Filled 2014-11-05: qty 1

## 2014-11-05 SURGICAL SUPPLY — 68 items
BANDAGE ELASTIC 4 VELCRO ST LF (GAUZE/BANDAGES/DRESSINGS) IMPLANT
BANDAGE ELASTIC 6 VELCRO ST LF (GAUZE/BANDAGES/DRESSINGS) IMPLANT
BANDAGE ESMARK 6X9 LF (GAUZE/BANDAGES/DRESSINGS) ×1 IMPLANT
BLADE SURG 15 STRL LF DISP TIS (BLADE) ×1 IMPLANT
BLADE SURG 15 STRL SS (BLADE) ×2
BNDG CMPR 9X6 STRL LF SNTH (GAUZE/BANDAGES/DRESSINGS) ×1
BNDG COHESIVE 4X5 TAN STRL (GAUZE/BANDAGES/DRESSINGS) ×2 IMPLANT
BNDG COHESIVE 6X5 TAN STRL LF (GAUZE/BANDAGES/DRESSINGS) IMPLANT
BNDG ESMARK 6X9 LF (GAUZE/BANDAGES/DRESSINGS) ×2
CANISTER SUCT 3000ML PPV (MISCELLANEOUS) ×2 IMPLANT
COVER SURGICAL LIGHT HANDLE (MISCELLANEOUS) ×2 IMPLANT
CUFF TOURNIQUET SINGLE 34IN LL (TOURNIQUET CUFF) ×2 IMPLANT
CUFF TOURNIQUET SINGLE 44IN (TOURNIQUET CUFF) IMPLANT
DRAPE C-ARM 42X72 X-RAY (DRAPES) ×2 IMPLANT
DRAPE C-ARMOR (DRAPES) ×2 IMPLANT
DRAPE IMP U-DRAPE 54X76 (DRAPES) ×2 IMPLANT
DRAPE INCISE IOBAN 66X45 STRL (DRAPES) ×1 IMPLANT
DRAPE U-SHAPE 47X51 STRL (DRAPES) ×3 IMPLANT
DRILL 2.6X122MM WL AO SHAFT (BIT) ×1 IMPLANT
DRSG PAD ABDOMINAL 8X10 ST (GAUZE/BANDAGES/DRESSINGS) ×1 IMPLANT
DURAPREP 26ML APPLICATOR (WOUND CARE) ×3 IMPLANT
ELECT CAUTERY BLADE 6.4 (BLADE) ×2 IMPLANT
ELECT REM PT RETURN 9FT ADLT (ELECTROSURGICAL) ×2
ELECTRODE REM PT RTRN 9FT ADLT (ELECTROSURGICAL) ×1 IMPLANT
FACESHIELD WRAPAROUND (MASK) ×2 IMPLANT
GAUZE SPONGE 4X4 12PLY STRL (GAUZE/BANDAGES/DRESSINGS) IMPLANT
GAUZE XEROFORM 1X8 LF (GAUZE/BANDAGES/DRESSINGS) ×1 IMPLANT
GAUZE XEROFORM 5X9 LF (GAUZE/BANDAGES/DRESSINGS) ×1 IMPLANT
GLOVE NEODERM STRL 7.5 LF PF (GLOVE) ×1 IMPLANT
GLOVE SURG NEODERM 7.5  LF PF (GLOVE) ×1
GLOVE SURG SYN 7.5  E (GLOVE) ×2
GLOVE SURG SYN 7.5 E (GLOVE) ×2 IMPLANT
GLOVE SURG SYN 7.5 PF PI (GLOVE) ×2 IMPLANT
GOWN STRL REIN XL XLG (GOWN DISPOSABLE) ×2 IMPLANT
KIT BASIN OR (CUSTOM PROCEDURE TRAY) ×2 IMPLANT
KIT ROOM TURNOVER OR (KITS) ×2 IMPLANT
NDL HYPO 25GX1X1/2 BEV (NEEDLE) IMPLANT
NEEDLE HYPO 25GX1X1/2 BEV (NEEDLE) IMPLANT
NS IRRIG 1000ML POUR BTL (IV SOLUTION) ×2 IMPLANT
PACK ORTHO EXTREMITY (CUSTOM PROCEDURE TRAY) ×2 IMPLANT
PAD ARMBOARD 7.5X6 YLW CONV (MISCELLANEOUS) ×3 IMPLANT
PAD CAST 3X4 CTTN HI CHSV (CAST SUPPLIES) IMPLANT
PAD CAST 4YDX4 CTTN HI CHSV (CAST SUPPLIES) ×1 IMPLANT
PADDING CAST COTTON 3X4 STRL (CAST SUPPLIES)
PADDING CAST COTTON 4X4 STRL (CAST SUPPLIES) ×2
PADDING CAST COTTON 6X4 STRL (CAST SUPPLIES) ×2 IMPLANT
PADDING CAST SYN 6 (CAST SUPPLIES) ×3
PADDING CAST SYNTHETIC 6X4 NS (CAST SUPPLIES) IMPLANT
PLATE DISTAL FIBULA 3HOLE (Plate) ×1 IMPLANT
SCREW 3.5X10MM (Screw) ×1 IMPLANT
SCREW BONE 18 (Screw) ×2 IMPLANT
SCREW BONE 50MMX3.5MM (Screw) ×1 IMPLANT
SCREW BONE NON-LCKING 3.5X12MM (Screw) ×4 IMPLANT
SCREW LOCK 3.5X10MM (Screw) ×2 IMPLANT
SCREW LOCKING 3.5X12 (Screw) ×2 IMPLANT
SCREW NONLOCK 22MM (Screw) ×1 IMPLANT
SPLINT FIBERGLASS 4X30 (CAST SUPPLIES) ×4 IMPLANT
SPONGE GAUZE 4X4 12PLY STER LF (GAUZE/BANDAGES/DRESSINGS) ×2 IMPLANT
SPONGE LAP 18X18 X RAY DECT (DISPOSABLE) IMPLANT
SUCTION FRAZIER TIP 10 FR DISP (SUCTIONS) ×2 IMPLANT
SUT ETHILON 3 0 PS 1 (SUTURE) ×2 IMPLANT
SUT VIC AB 2-0 CT1 27 (SUTURE) ×2
SUT VIC AB 2-0 CT1 TAPERPNT 27 (SUTURE) ×1 IMPLANT
SYR CONTROL 10ML LL (SYRINGE) IMPLANT
TOWEL OR 17X24 6PK STRL BLUE (TOWEL DISPOSABLE) ×2 IMPLANT
TOWEL OR 17X26 10 PK STRL BLUE (TOWEL DISPOSABLE) ×4 IMPLANT
TUBE CONNECTING 12X1/4 (SUCTIONS) ×2 IMPLANT
WATER STERILE IRR 1000ML POUR (IV SOLUTION) IMPLANT

## 2014-11-05 NOTE — Anesthesia Procedure Notes (Addendum)
Procedure Name: LMA Insertion Date/Time: 11/05/2014 11:29 AM Performed by: Vennie Homans Pre-anesthesia Checklist: Patient identified, Timeout performed, Emergency Drugs available, Suction available and Patient being monitored Patient Re-evaluated:Patient Re-evaluated prior to inductionOxygen Delivery Method: Circle system utilized Preoxygenation: Pre-oxygenation with 100% oxygen Intubation Type: IV induction Ventilation: Mask ventilation without difficulty LMA: LMA inserted LMA Size: 4.0 Number of attempts: 1 Placement Confirmation: positive ETCO2 and breath sounds checked- equal and bilateral Tube secured with: Tape Dental Injury: Teeth and Oropharynx as per pre-operative assessment     Anesthesia Regional Block:  Popliteal block  Pre-Anesthetic Checklist: ,, timeout performed, Correct Patient, Correct Site, Correct Laterality, Correct Procedure, Correct Position, site marked, Risks and benefits discussed,  Surgical consent,  Pre-op evaluation,  At surgeon's request and post-op pain management  Laterality: Lower and Left  Prep: chloraprep       Needles:  Injection technique: Single-shot  Needle Type: Echogenic Stimulator Needle          Additional Needles:  Procedures: ultrasound guided (picture in chart) and nerve stimulator Popliteal block  Nerve Stimulator or Paresthesia:  Response: plantar, 0.5 mA,   Additional Responses:   Narrative:  Injection made incrementally with aspirations every 5 mL.  Performed by: Personally  Anesthesiologist: Pasty Manninen, CHRIS  Additional Notes: H+P and labs reviewed, risks and benefits discussed with patient, procedure tolerated well without complications   Anesthesia Regional Block:  Adductor canal block  Pre-Anesthetic Checklist: ,, timeout performed, Correct Patient, Correct Site, Correct Laterality, Correct Procedure, Correct Position, site marked, Risks and benefits discussed,  Surgical consent,  Pre-op evaluation,  At  surgeon's request and post-op pain management  Laterality: Lower and Left  Prep: chloraprep       Needles:  Injection technique: Single-shot  Needle Type: Echogenic Stimulator Needle          Additional Needles:  Procedures: ultrasound guided (picture in chart) Adductor canal block Narrative:  Injection made incrementally with aspirations every 5 mL.  Performed by: Personally  Anesthesiologist: Mirza Fessel, CHRIS  Additional Notes: H+P and labs reviewed, risks and benefits discussed with patient, procedure tolerated well without complications

## 2014-11-05 NOTE — Anesthesia Preprocedure Evaluation (Signed)
Anesthesia Evaluation  Patient identified by MRN, date of birth, ID band Patient awake    Reviewed: Allergy & Precautions, NPO status , Patient's Chart, lab work & pertinent test results  History of Anesthesia Complications Negative for: history of anesthetic complications  Airway Mallampati: II  TM Distance: >3 FB Neck ROM: Full    Dental  (+) Missing, Teeth Intact   Pulmonary former smoker,  breath sounds clear to auscultation        Cardiovascular hypertension, Pt. on medications Rhythm:Regular     Neuro/Psych  Headaches, PSYCHIATRIC DISORDERS Anxiety Depression    GI/Hepatic Neg liver ROS, GERD-  Medicated and Controlled,  Endo/Other  Morbid obesity  Renal/GU negative Renal ROS     Musculoskeletal   Abdominal   Peds  Hematology   Anesthesia Other Findings   Reproductive/Obstetrics                             Anesthesia Physical Anesthesia Plan  ASA: II  Anesthesia Plan: General and Regional   Post-op Pain Management:    Induction: Intravenous  Airway Management Planned: LMA  Additional Equipment: None  Intra-op Plan:   Post-operative Plan: Extubation in OR  Informed Consent: I have reviewed the patients History and Physical, chart, labs and discussed the procedure including the risks, benefits and alternatives for the proposed anesthesia with the patient or authorized representative who has indicated his/her understanding and acceptance.   Dental advisory given  Plan Discussed with: CRNA and Surgeon  Anesthesia Plan Comments:         Anesthesia Quick Evaluation

## 2014-11-05 NOTE — Discharge Instructions (Signed)
° ° °  1. Keep splint clean and dry °2. Elevate foot above level of the heart °3. Take aspirin to prevent blood clots °4. Take pain meds as needed °5. Strict non weight bearing to operative extremity ° °

## 2014-11-05 NOTE — H&P (Signed)
PREOPERATIVE H&P  Chief Complaint: Left ankle fracture-dislocation  HPI: Kristin Rose is a 54 y.o. female who presents for surgical treatment of Left ankle fracture-dislocation.  She denies any changes in medical history.  Past Medical History  Diagnosis Date  . Hypertension   . Depression   . Heart murmur     as a child  . Anxiety   . GERD (gastroesophageal reflux disease)   . Headache     migraines  . Cancer     breast, left breast lumpectomy    Past Surgical History  Procedure Laterality Date  . Tubal ligation    . Breast lumpectomy Left   . Colonoscopy     History   Social History  . Marital Status: Married    Spouse Name: N/A  . Number of Children: N/A  . Years of Education: N/A   Social History Main Topics  . Smoking status: Former Smoker -- 0.00 packs/day    Quit date: 03/02/2014  . Smokeless tobacco: Never Used  . Alcohol Use: No  . Drug Use: No  . Sexual Activity: Yes    Birth Control/ Protection: Condom   Other Topics Concern  . None   Social History Narrative   Family History  Problem Relation Age of Onset  . Pulmonary embolism Mother   . Diabetes type II Mother   . Hypertension Mother   . Hypertension Father   . Glaucoma Father   . Lung cancer Father    Allergies  Allergen Reactions  . Lisinopril Cough  . Percocet [Oxycodone-Acetaminophen] Itching   Prior to Admission medications   Medication Sig Start Date End Date Taking? Authorizing Provider  amLODipine (NORVASC) 10 MG tablet Take 10 mg by mouth every morning.     Historical Provider, MD  aspirin-acetaminophen-caffeine (EXCEDRIN MIGRAINE) [phone] MG per tablet Take 2 tablets by mouth every 8 (eight) hours as needed for headache.     Historical Provider, MD  Aspirin-Salicylamide-Caffeine (BC HEADACHE POWDER PO) Take 1 Package by mouth daily as needed (for pain).    Historical Provider, MD  HYDROcodone-acetaminophen (NORCO) 5-325 MG per tablet Take 1-2 tablets by mouth every 6  (six) hours as needed. Patient taking differently: Take 1-2 tablets by mouth every 4 (four) hours as needed.  11/01/14   Ernestina Patches, MD  losartan-hydrochlorothiazide (HYZAAR) 100-25 MG per tablet Take 1 tablet by mouth every morning.    Historical Provider, MD  metoprolol (LOPRESSOR) 100 MG tablet Take 100 mg by mouth 2 (two) times daily.    Historical Provider, MD  omeprazole (PRILOSEC) 20 MG capsule Take 20 mg by mouth daily. 10/07/14   Historical Provider, MD  SUMAtriptan (IMITREX) 50 MG tablet Take 50 mg by mouth every 2 (two) hours as needed for migraine or headache. May repeat in 2 hours if headache persists or recurs.    Historical Provider, MD  venlafaxine XR (EFFEXOR-XR) 150 MG 24 hr capsule Take 150 mg by mouth daily.     Historical Provider, MD  venlafaxine XR (EFFEXOR-XR) 75 MG 24 hr capsule Take 75 mg by mouth daily.     Historical Provider, MD  zolpidem (AMBIEN) 10 MG tablet Take 10 mg by mouth at bedtime.     Historical Provider, MD     Positive ROS: All other systems have been reviewed and were otherwise negative with the exception of those mentioned in the HPI and as above.  Physical Exam: General: Alert, no acute distress Cardiovascular: No pedal edema Respiratory: No  cyanosis, no use of accessory musculature GI: abdomen soft Skin: No lesions in the area of chief complaint Neurologic: Sensation intact distally Psychiatric: Patient is competent for consent with normal mood and affect Lymphatic: no lymphedema  MUSCULOSKELETAL: exam stable  Assessment: Left ankle fracture-dislocation  Plan: Plan for Procedure(s): OPEN REDUCTION INTERNAL FIXATION (ORIF) LEFT BIMALLEOLAR ANKLE FRACTURE, SYNDESMOSIS  The risks benefits and alternatives were discussed with the patient including but not limited to the risks of nonoperative treatment, versus surgical intervention including infection, bleeding, nerve injury,  blood clots, cardiopulmonary complications, morbidity, mortality,  among others, and they were willing to proceed.   Marianna Payment, MD   11/05/2014 7:59 AM

## 2014-11-05 NOTE — Op Note (Signed)
   Date of Surgery: 11/05/2014  INDICATIONS: Ms. Ontko is a 54 y.o.-year-old female who sustained a left ankle fracture; she was indicated for open reduction and internal fixation due to the displaced nature of the articular fracture and came to the operating room today for this procedure. The patient did consent to the procedure after discussion of the risks and benefits.  PREOPERATIVE DIAGNOSIS: left ankle fracture with syndesmotic injury  POSTOPERATIVE DIAGNOSIS: Same.  PROCEDURE:  1. Open treatment of left ankle fracture with internal fixation. Lateral malleolar CPT [id number] 2. Open treatment of left syndesmotic injury with internal fixation.  SURGEON: N. Eduard Roux, M.D.  ASSIST: Ky Barban, RNFA.  ANESTHESIA:  general, regional  TOURNIQUET TIME: less than 30 minutes  IV FLUIDS AND URINE: See anesthesia.  ESTIMATED BLOOD LOSS: minimal mL.  IMPLANTS: [employer] Variax 3 hole distal fibula plate  COMPLICATIONS: None.  DESCRIPTION OF PROCEDURE: The patient was brought to the operating room and placed supine on the operating table.  The patient had been signed prior to the procedure and this was documented. The patient had the anesthesia placed by the anesthesiologist.  A nonsterile tourniquet was placed on the upper thigh.  The prep verification and incision time-outs were performed to confirm that this was the correct patient, site, side and location. The patient had an SCD on the opposite lower extremity. The patient did receive antibiotics prior to the incision and was re-dosed during the procedure as needed at indicated intervals.  The patient had the lower extremity prepped and draped in the standard surgical fashion.  The extremity was exsanguinated using an esmarch bandage and the tourniquet was inflated to 300 mm Hg.  The distal fibula bony landmarks were palpated. A lateral incision was used. Full-thickness flaps were created. The fracture was exposed. Organized hematoma and  entrapped periosteum was removed from the fracture site. The fracture was reduced and clamped. We then placed a anterior to posterior lag screw. The clamp was removed and the fracture stayed reduced. We then placed a 3 hole distal fibular plate on the lateral aspect of the fibula in neutralization fashion. We placed 3 nonlocking screws proximally each with excellent purchase. We then placed 3 locking screws distally unicortically.  After this was done we then reduced the syndesmosis and placed 1 tetra cortical syndesmotic screw parallel to the joint.  Final x-rays were taken to confirm appropriate reduction and placement of the hardware. The wound was thoroughly irrigated. The wound was closed in layer fashion using 2-0 Vicryls and 3-0 nylon.  Sterile dressings were applied. The extremity was immobilized in a short-leg splint. Patient tolerated the procedure well was extubated and transferred to the PACU in stable condition. All sponge counts were correct.  POSTOPERATIVE PLAN: Ms. Siegfried will remain nonweightbearing on this leg for approximately 6 weeks; Ms. Antonetti will return for suture removal in 2 weeks.  He will be immobilized in a short leg splint and then transitioned to a CAM walker at his first follow up appointment.  Ms. Maragh will receive DVT prophylaxis based on other medications, activity level, and risk ratio of bleeding to thrombosis.  Azucena Cecil, MD [facility] [phone] 12:37 PM

## 2014-11-05 NOTE — Transfer of Care (Signed)
Immediate Anesthesia Transfer of Care Note  Patient: Kristin Rose  Procedure(s) Performed: Procedure(s): OPEN REDUCTION INTERNAL FIXATION (ORIF) LEFT BIMALLEOLAR ANKLE FRACTURE, SYNDESMOSIS (Left)  Patient Location: PACU  Anesthesia Type:GA combined with regional for post-op pain  Level of Consciousness: sedated, patient cooperative, lethargic and responds to stimulation  Airway & Oxygen Therapy: Patient Spontanous Breathing and Patient connected to nasal cannula oxygen  Post-op Assessment: Report given to RN, Post -op Vital signs reviewed and stable, Patient moving all extremities X 4 and Patient able to stick tongue midline  Post vital signs: stable  Last Vitals:  Filed Vitals:   11/05/14 0855  BP: 102/77  Pulse: 69  Temp: 36.7 C  Resp: 20    Complications: No apparent anesthesia complications

## 2014-11-05 NOTE — Anesthesia Postprocedure Evaluation (Signed)
  Anesthesia Post-op Note  Patient: Kristin Rose  Procedure(s) Performed: Procedure(s): OPEN REDUCTION INTERNAL FIXATION (ORIF) LEFT BIMALLEOLAR ANKLE FRACTURE, SYNDESMOSIS (Left)  Patient Location: PACU  Anesthesia Type:General and Regional  Level of Consciousness: awake  Airway and Oxygen Therapy: Patient Spontanous Breathing  Post-op Pain: none  Post-op Assessment: Post-op Vital signs reviewed, Patient's Cardiovascular Status Stable, Respiratory Function Stable, Patent Airway, No signs of Nausea or vomiting and Pain level controlled   LLE Sensation: Numbness, No pain   RLE Sensation: Full sensation      Post-op Vital Signs: Reviewed and stable  Last Vitals:  Filed Vitals:   11/05/14 1354  BP: 103/64  Pulse: 74  Temp: 36.6 C  Resp: 18    Complications: No apparent anesthesia complications

## 2014-11-05 NOTE — Evaluation (Signed)
Physical Therapy Evaluation Patient Details Name: CELINDA DETHLEFS MRN: [id number] DOB: Oct 01, 1960 Today's Date: 11/05/2014   History of Present Illness  54 y.o. female s/p Open treatment of left ankle fracture with internal fixation  Clinical Impression  Patient is s/p above surgery resulting in functional limitations due to the deficits listed below (see PT Problem List). Tolerated evaluation well. Prefers RW to crutches, decided from home use of both after initial fracture. Ambulates up to 15 feet today with close guard for safety. No loss of balance. Educated on positioning for optimal healing and therapeutic exercises. Needs to practice stairs prior to d/c. Patient will benefit from skilled PT to increase their independence and safety with mobility to allow discharge to the venue listed below.       Follow Up Recommendations No PT follow up;Supervision for mobility/OOB    Equipment Recommendations  None recommended by PT    Recommendations for Other Services OT consult     Precautions / Restrictions Precautions Precautions: Fall Restrictions Weight Bearing Restrictions: Yes LLE Weight Bearing: Non weight bearing      Mobility  Bed Mobility Overal bed mobility: Modified Independent             General bed mobility comments: extra time  Transfers Overall transfer level: Needs assistance Equipment used: Rolling walker (2 wheeled) Transfers: Sit to/from Stand Sit to Stand: Supervision         General transfer comment: supervision for safety VC for hand placement and to maintain NWB on LLE. No physical assist required.  Ambulation/Gait Ambulation/Gait assistance: Min guard Ambulation Distance (Feet): 15 Feet Assistive device: Rolling walker (2 wheeled) Gait Pattern/deviations:  ("hop-to" pattern) Gait velocity: decreased Gait velocity interpretation: Below normal speed for age/gender General Gait Details: Min guard for safety. Educated on safe DME use with a  rolling walker. Cues to have RW stop rolling before taking a step. Intermittent cues to keep NWB on LLE. Balance overall looks stable with RW for support. Encouraged to keep forward pressure on RW to prevent posterior lean.  Stairs            Wheelchair Mobility    Modified Rankin (Stroke Patients Only)       Balance Overall balance assessment: Needs assistance Sitting-balance support: No upper extremity supported;Feet supported Sitting balance-Leahy Scale: Good     Standing balance support: Single extremity supported Standing balance-Leahy Scale: Poor                               Pertinent Vitals/Pain Pain Assessment: No/denies pain    Home Living Family/patient expects to be discharged to:: Private residence Living Arrangements: Spouse/significant other;Children Available Help at Discharge: Family;Available 24 hours/day Type of Home: House Home Access: Stairs to enter Entrance Stairs-Rails: Psychiatric nurse of Steps: 5 Home Layout: One level Home Equipment: Walker - 2 wheels;Crutches      Prior Function Level of Independence: Independent               Hand Dominance   Dominant Hand: Right    Extremity/Trunk Assessment   Upper Extremity Assessment: Defer to OT evaluation           Lower Extremity Assessment: LLE deficits/detail   LLE Deficits / Details: no sensation in toes on Lt foot. unable to move at this time.     Communication   Communication: No difficulties  Cognition Arousal/Alertness: Awake/alert Behavior During Therapy: WFL for tasks assessed/performed Overall Cognitive  Status: Within Functional Limits for tasks assessed                      General Comments General comments (skin integrity, edema, etc.): Educated on elevated positioning and signs/symptoms of compartment syndrome    Exercises General Exercises - Lower Extremity Gluteal Sets: Strengthening;Both;10 reps;Seated Long Arc  Quad: Strengthening;Left;10 reps;Seated Hip Flexion/Marching: Strengthening;Left;10 reps;Seated      Assessment/Plan    PT Assessment Patient needs continued PT services  PT Diagnosis Difficulty walking;Abnormality of gait   PT Problem List Decreased strength;Decreased range of motion;Decreased activity tolerance;Decreased balance;Decreased mobility;Decreased knowledge of use of DME;Decreased knowledge of precautions;Impaired sensation  PT Treatment Interventions DME instruction;Gait training;Stair training;Functional mobility training;Therapeutic activities;Therapeutic exercise;Balance training;Patient/family education;Modalities   PT Goals (Current goals can be found in the Care Plan section) Acute Rehab PT Goals Patient Stated Goal: none stated PT Goal Formulation: With patient Time For Goal Achievement: 11/19/14 Potential to Achieve Goals: Good    Frequency Min 5X/week   Barriers to discharge        Co-evaluation               End of Session Equipment Utilized During Treatment: Gait belt Activity Tolerance: Patient tolerated treatment well Patient left: in chair;with call bell/phone within reach;with family/visitor present Nurse Communication: Mobility status    Functional Assessment Tool Used: clinical observation Functional Limitation: Mobility: Walking and moving around Mobility: Walking and Moving Around Current Status [phone]): At least 1 percent but less than 20 percent impaired, limited or restricted    Time: 1717-1743 PT Time Calculation (min) (ACUTE ONLY): 26 min   Charges:   PT Evaluation $Initial PT Evaluation Tier I: 1 Procedure PT Treatments $Therapeutic Activity: 8-22 mins   PT G Codes:   PT G-Codes **NOT FOR INPATIENT CLASS** Functional Assessment Tool Used: clinical observation Functional Limitation: Mobility: Walking and moving around Mobility: Walking and Moving Around Current Status (E5631): At least 1 percent but less than 20 percent  impaired, limited or restricted    Ellouise Newer 11/05/2014, 6:36 PM Camille Bal [place], [place]

## 2014-11-06 ENCOUNTER — Encounter (HOSPITAL_COMMUNITY): Payer: Self-pay | Admitting: Orthopaedic Surgery

## 2014-11-06 NOTE — Progress Notes (Signed)
   Subjective:  Patient reports pain as mild.    Objective:   VITALS:   Filed Vitals:   11/05/14 1749 11/05/14 2301 11/06/14 0234 11/06/14 0625  BP: 114/59 118/59 135/87 129/84  Pulse: 74 81 84 89  Temp: 98 F (36.7 C) 98.7 F (37.1 C) 99.1 F (37.3 C) 99.5 F (37.5 C)  TempSrc: Oral Oral    Resp: 20 18 17 18   Height:      Weight:      SpO2: 93% 97% 94% 97%    Neurologically intact Neurovascular intact Sensation intact distally Intact pulses distally Dorsiflexion/Plantar flexion intact Incision: dressing C/D/I and no drainage No cellulitis present Compartment soft   Lab Results  Component Value Date   WBC 7.5 11/05/2014   HGB 13.1 11/05/2014   HCT 39.0 11/05/2014   MCV 98.0 11/05/2014   PLT 415* 11/05/2014     Assessment/Plan:  1 Day Post-Op   - Up with PT/OT - DVT ppx - SCDs, ambulation, asa - NWB left lower extremity - Pain controlled - Discharge planning - home today after PT - Rx in chart  Marianna Payment 11/06/2014, 7:34 AM [phone]

## 2014-11-06 NOTE — Progress Notes (Signed)
Occupational Therapy Evaluation Patient Details Name: Kristin Rose MRN: [id number] DOB: 1960-08-09 Today's Date: 11/06/2014    History of Present Illness 54 y.o. female s/p Open treatment of left ankle fracture with internal fixation   Clinical Impression   Patient admitted with above. Patient independent PTA. Patient currently functioning at an overall supervision->mod I level. D/C from acute OT services and no additional follow-up OT needs at this time, pt's husband will be available prn post acute d/c. All appropriate education provided to patient. Please re-order OT if needed.  Pt will need a tub transfer bench for safety with showers. Pt states she has a BSC at home.     Follow Up Recommendations  No OT follow up;Supervision - Intermittent    Equipment Recommendations  Tub/shower bench    Recommendations for Other Services  None at this time  Precautions / Restrictions Precautions Precautions: Fall Restrictions Weight Bearing Restrictions: Yes LLE Weight Bearing: Non weight bearing    Mobility Bed Mobility Overal bed mobility: Modified Independent General bed mobility comments: extra time, HOB elevated, use of bed rails prn  Transfers Overall transfer level: Needs assistance Equipment used: Rolling walker (2 wheeled) Transfers: Sit to/from Stand Sit to Stand: Supervision General transfer comment: supervision for safety VC for hand placement. No physical assist required. Pt adhered to NWB without cues.     Balance Overall balance assessment: Needs assistance Sitting-balance support: No upper extremity supported;Single extremity supported Sitting balance-Leahy Scale: Good     Standing balance support: Single extremity supported;Bilateral upper extremity supported Standing balance-Leahy Scale: Fair    ADL Overall ADL's : Needs assistance/impaired General ADL Comments: Pt overall supervision>mod I with ADLs and functional mobility. Pt states her husband will be  available prn for assistance. Pt safe with RW. Discussed use of tub transfer bench and educated patient on how to use; recommending tub transfer bench for showers for safety.      Vision Additional Comments: no change from baseline          Pertinent Vitals/Pain Pain Assessment: No/denies pain     Hand Dominance Right   Extremity/Trunk Assessment Upper Extremity Assessment Upper Extremity Assessment: Overall WFL for tasks assessed   Lower Extremity Assessment Lower Extremity Assessment: Defer to PT evaluation   Cervical / Trunk Assessment Cervical / Trunk Assessment: Normal   Communication Communication Communication: No difficulties   Cognition Arousal/Alertness: Awake/alert Behavior During Therapy: WFL for tasks assessed/performed Overall Cognitive Status: Within Functional Limits for tasks assessed             Home Living Family/patient expects to be discharged to:: Private residence Living Arrangements: Spouse/significant other;Children Available Help at Discharge: Family;Available 24 hours/day Type of Home: House Home Access: Stairs to enter [employer] of Steps: 5 Entrance Stairs-Rails: Right;Left Home Layout: One level     Bathroom Shower/Tub: Tub/shower unit;Curtain   Bathroom Toilet: Handicapped height     Home Equipment: Environmental consultant - 2 wheels;Crutches   Prior Functioning/Environment Level of Independence: Independent     OT Diagnosis: Generalized weakness;Acute pain   OT Problem List:  n/a, no acute OT needs identified    OT Treatment/Interventions:   n/a, no acute OT needs identified    OT Goals(Current goals can be found in the care plan section) Acute Rehab OT Goals Patient Stated Goal: none stated OT Goal Formulation: All assessment and education complete, DC therapy  OT Frequency:  n/a, no acute OT needs identified    Barriers to D/C:  None known at this time  End of Session Equipment Utilized During Treatment: Rolling  walker  Activity Tolerance: Patient tolerated treatment well Patient left: in chair;with call bell/phone within reach   Time: 0830-0846 OT Time Calculation (min): 16 min Charges:  OT General Charges $OT Visit: 1 Procedure OT Evaluation $Initial OT Evaluation Tier I: 1 Procedure G-Codes: OT G-codes **NOT FOR INPATIENT CLASS** Functional Limitation: Self care Self Care Current Status (F1638): At least 1 percent but less than 20 percent impaired, limited or restricted Self Care Goal Status (G6659): At least 1 percent but less than 20 percent impaired, limited or restricted Self Care Discharge Status [phone]): At least 1 percent but less than 20 percent impaired, limited or restricted  Iyania Denne , MS, OTR/L, CLT Pager: [phone]  11/06/2014, 8:56 AM

## 2014-11-06 NOTE — Discharge Planning (Signed)
Patient discharged home in stable condition. Verbalizes understanding of all discharge instructions, including home medications and follow up appointments. 

## 2014-11-06 NOTE — Discharge Summary (Signed)
Physician Discharge Summary      Patient ID: Kristin Rose MRN: [id number] DOB/AGE: 09-Mar-1961 54 y.o.  Admit date: 11/05/2014 Discharge date: 11/06/2014  Admission Diagnoses:  <principal problem not specified>  Discharge Diagnoses:  Active Problems:   S/P ORIF (open reduction internal fixation) fracture   Past Medical History  Diagnosis Date  . Hypertension   . Depression   . Heart murmur     as a child  . GERD (gastroesophageal reflux disease)   . Cancer of left breast 01/2004  . Migraine     "weekly" (11/06/2014)    Surgeries: Procedure(s): OPEN REDUCTION INTERNAL FIXATION (ORIF) LEFT BIMALLEOLAR ANKLE FRACTURE, SYNDESMOSIS on 11/05/2014   Consultants (if any):    Discharged Condition: Improved  Hospital Course: Kristin Rose is an 54 y.o. female who was admitted 11/05/2014 with a diagnosis of <principal problem not specified> and went to the operating room on 11/05/2014 and underwent the above named procedures.    She was given perioperative antibiotics:  Anti-infectives    Start     Dose/Rate Route Frequency Ordered Stop   11/05/14 1730  ceFAZolin (ANCEF) IVPB 2 g/50 mL premix     2 g 100 mL/hr over 30 Minutes Intravenous Every 6 hours 11/05/14 1356 11/06/14 0558   11/05/14 0829  ceFAZolin (ANCEF) IVPB 2 g/50 mL premix     2 g 100 mL/hr over 30 Minutes Intravenous On call to O.R. 11/05/14 [id number] 11/05/14 1126    .  She was given sequential compression devices, early ambulation, and aspirin  for DVT prophylaxis.  She benefited maximally from the hospital stay and there were no complications.    Recent vital signs:  Filed Vitals:   11/06/14 0625  BP: 129/84  Pulse: 89  Temp: 99.5 F (37.5 C)  Resp: 18    Recent laboratory studies:  Lab Results  Component Value Date   HGB 13.1 11/05/2014   HGB 13.1 01/12/2014   HGB 14.3 01/31/2012   Lab Results  Component Value Date   WBC 7.5 11/05/2014   PLT 415* 11/05/2014   No results found for: INR Lab  Results  Component Value Date   NA 140 11/05/2014   K 3.7 11/05/2014   CL 102 11/05/2014   CO2 28 11/05/2014   BUN 13 11/05/2014   CREATININE 0.82 11/05/2014   GLUCOSE 137* 11/05/2014    Discharge Medications:     Medication List    STOP taking these medications        HYDROcodone-acetaminophen 5-325 MG per tablet  Commonly known as:  NORCO  Replaced by:  HYDROcodone-acetaminophen 7.5-325 MG per tablet      TAKE these medications        amLODipine 10 MG tablet  Commonly known as:  NORVASC  Take 10 mg by mouth every morning.     aspirin EC 325 MG tablet  Take 1 tablet (325 mg total) by mouth 2 (two) times daily.     aspirin-acetaminophen-caffeine 875-643-32 MG per tablet  Commonly known as:  EXCEDRIN MIGRAINE  Take 2 tablets by mouth every 8 (eight) hours as needed for headache.     BC HEADACHE POWDER PO  Take 1 Package by mouth daily as needed (for pain).     HYDROcodone-acetaminophen 7.5-325 MG per tablet  Commonly known as:  NORCO  Take 1-2 tablets by mouth every 6 (six) hours as needed for moderate pain.     losartan-hydrochlorothiazide 100-25 MG per tablet  Commonly known as:  HYZAAR  Take 1 tablet by mouth every morning.     metoprolol 100 MG tablet  Commonly known as:  LOPRESSOR  Take 100 mg by mouth 2 (two) times daily.     omeprazole 20 MG capsule  Commonly known as:  PRILOSEC  Take 20 mg by mouth daily.     SUMAtriptan 50 MG tablet  Commonly known as:  IMITREX  Take 50 mg by mouth every 2 (two) hours as needed for migraine or headache. May repeat in 2 hours if headache persists or recurs.     venlafaxine XR 150 MG 24 hr capsule  Commonly known as:  EFFEXOR-XR  Take 150 mg by mouth daily.     venlafaxine XR 75 MG 24 hr capsule  Commonly known as:  EFFEXOR-XR  Take 75 mg by mouth daily.     zolpidem 10 MG tablet  Commonly known as:  AMBIEN  Take 10 mg by mouth at bedtime.        Diagnostic Studies: Dg Ankle Complete Left  November 14, 2014    CLINICAL DATA:  ORIF for left bimalleolar ankle fracture.  EXAM: DG C-ARM 61-120 MIN; LEFT ANKLE COMPLETE - 3+ VIEW  COMPARISON:  Ankle series dated November 01, 2014  FINDINGS: Fluoro time reported is 24 seconds, 3 fluoro spot images submitted  The patient has undergone placement of a side plate with compression screws for the lateral malleolar fracture. Alignment is anatomic. The ankle joint mortise is preserved. No medial malleolar fracture fragment is observed.  IMPRESSION: The patient has undergone ORIF for a lateral malleolar fracture and tibio-fibulo-talar dislocation. There is no immediate postprocedure complication.   Electronically Signed   By: David  Martinique M.D.   On: 2014/11/14 14:29   Dg Ankle Complete Left  11/01/2014   CLINICAL DATA:  Left ankle injury, post reduction  EXAM: LEFT ANKLE COMPLETE - 3+ VIEW  COMPARISON:  None.  FINDINGS: Improved alignment status post reduction. Persistent widening of the medial ankle mortise.  Distal fibular fracture, minimally displaced.  Overlying cast/splint obscures fine osseous detail.  The base of the fifth metatarsal is unremarkable.  Mild soft tissue swelling.  IMPRESSION: Improved alignment status post reduction. Persist widening of the medial ankle mortise.  Distal fibular fracture, minimally displaced.   Electronically Signed   By: Julian Hy M.D.   On: 11/01/2014 19:07   Dg Ankle Complete Left  11/01/2014   CLINICAL DATA:  Left ankle deformity.  Fall.  EXAM: LEFT ANKLE COMPLETE - 3+ VIEW  COMPARISON:  None.  FINDINGS: There is a fracture dislocation involving the ankle. There is been complete fracture of the distal fibula. Medial dislocation of the tibiotalar joint is identified. Posterior angulation of the talus and distal fibular fracture fragment noted.  IMPRESSION: 1. Complex fracture dislocation of the tibiotalar joint and distal fibula.   Electronically Signed   By: Kerby Moors M.D.   On: 11/01/2014 16:05   Ct Ankle Left Wo  Contrast  11/01/2014   CLINICAL DATA:  Left ankle fracture dislocation, status post reduction.  EXAM: CT OF THE LEFT ANKLE WITHOUT CONTRAST  TECHNIQUE: Multidetector CT imaging of the left ankle was performed according to the standard protocol. Multiplanar CT image reconstructions were also generated.  COMPARISON:  11/01/2014  FINDINGS: Oblique fracture of the lateral malleolus observed, minimal comminution.  The tibiotalar component of the deltoid ligament is ruptured, and the distance between the medial malleolus and the talus is 7 mm as compared to the craniocaudad tibiotalar distance of 4 mm.  There several bony fragments interposed between the medial malleolus and talus which probably originating from the medial malleolus posteriorly. The distal fibular fracture fragment is still displaced about 4 mm lateral to the proximal shaft fragment, and the proximal shaft fragment extends slightly into the tibiotalar space as shown on image 32 series 5. On image 29 series 5 we observe a small triangular fracture likely originating from the fibula interposed in the anteromedial tibiotalar space.  I do not observe fractures of the talus, calcaneus, cuboid, navicular, cuneiforms, or of the bases of the metatarsals. No malalignment at the Lisfranc joint. There is certainly edema in the sinus tarsi but this may be incidental. No obvious disruption of the Achilles tendon or plantar fascia.  No flexor tendon entrapment. Small amount of gas in the subcutaneous tissues superficial to the extensor digitorum longus, image 44 series 4.  IMPRESSION: 1. Comminuted oblique lateral malleolar fracture with avulsion/tear of the tibiotalar component of the deltoid ligament, allowing for 3 additional mm of abnormal medial subluxation of the tibia with respect to the talus, and mild interposition of the fractured indents the proximal fragment of the distal fibula into the tibiotalar space. There is small bony fragments interposed between the  medial malleolus and the talus, likely representing avulsion fragments. We do not demonstrate tendon entrapment nor are there other fractures evident.   Electronically Signed   By: Van Clines M.D.   On: 11/01/2014 20:22   Dg C-arm 1-60 Min  11/05/2014   CLINICAL DATA:  ORIF for left bimalleolar ankle fracture.  EXAM: DG C-ARM 61-120 MIN; LEFT ANKLE COMPLETE - 3+ VIEW  COMPARISON:  Ankle series dated November 01, 2014  FINDINGS: Fluoro time reported is 24 seconds, 3 fluoro spot images submitted  The patient has undergone placement of a side plate with compression screws for the lateral malleolar fracture. Alignment is anatomic. The ankle joint mortise is preserved. No medial malleolar fracture fragment is observed.  IMPRESSION: The patient has undergone ORIF for a lateral malleolar fracture and tibio-fibulo-talar dislocation. There is no immediate postprocedure complication.   Electronically Signed   By: David  Martinique M.D.   On: 11/05/2014 14:29    Disposition: 01-Home or Self Care      Discharge Instructions    Call MD / Call 911    Complete by:  As directed   If you experience chest pain or shortness of breath, CALL 911 and be transported to the hospital emergency room.  If you develope a fever above 101.5 F, pus (white drainage) or increased drainage or redness at the wound, or calf pain, call your surgeon's office.     Constipation Prevention    Complete by:  As directed   Drink plenty of fluids.  Prune juice may be helpful.  You may use a stool softener, such as Colace (over the counter) 100 mg twice a day.  Use MiraLax (over the counter) for constipation as needed.     Diet - low sodium heart healthy    Complete by:  As directed      Diet general    Complete by:  As directed      Driving restrictions    Complete by:  As directed   No driving while taking narcotic pain meds.     Increase activity slowly as tolerated    Complete by:  As directed            Follow-up Information     Follow up with Frankey Shown  Legrand Como, MD In 2 weeks.   Specialty:  Orthopedic Surgery   Why:  For suture removal, For wound re-check   Contact information:   [address] [place] [address] [phone]        Signed: Marianna Payment 11/06/2014, 7:33 PM

## 2014-11-06 NOTE — Progress Notes (Signed)
Physical Therapy Treatment Patient Details Name: Kristin Rose MRN: [id number] DOB: 1961-04-02 Today's Date: 11/06/2014    History of Present Illness 54 y.o. female s/p Open treatment of left ankle fracture with internal fixation    PT Comments    Good progress towards PT goals. Ambulating with steady pattern using a rolling walker for support and no physical assist required for balance. Completed stair training with a rolling walker however, pt states she feels more comfortable using rail and arm around husbands shoulders to ascend/descend stairs at home. Maintains NWB status on LLE. Adequate for d/c from PT standpoint as she will have assistance form her husband as needed.  Follow Up Recommendations  No PT follow up;Supervision for mobility/OOB     Equipment Recommendations  None recommended by PT    Recommendations for Other Services OT consult     Precautions / Restrictions Precautions Precautions: Fall Restrictions Weight Bearing Restrictions: Yes LLE Weight Bearing: Non weight bearing    Mobility  Bed Mobility Overal bed mobility: Modified Independent             General bed mobility comments: in chair  Transfers Overall transfer level: Needs assistance Equipment used: Rolling walker (2 wheeled) Transfers: Sit to/from Stand Sit to Stand: Supervision         General transfer comment: supervision for safety VC for hand placement, maintains NWB on LLE. No physical assist required.  Ambulation/Gait Ambulation/Gait assistance: Supervision Ambulation Distance (Feet): 45 Feet Assistive device: Rolling walker (2 wheeled) Gait Pattern/deviations:  ("hop-to" pattern) Gait velocity: decreased   General Gait Details: Supervision for safety. VC to assure RW has stopped advancing prior to bearing weight through UEs. No loss of balance or instability during this bout.  Maintains NWB on LLE.   Stairs Stairs: Yes Stairs assistance: Min assist Stair Management: No  rails;Step to pattern;Backwards;With walker Number of Stairs: 2 General stair comments: Stair training with RW. Educated on technique with VC for sequencing and min assist to block RW. Pt somewhat anxious with this task and states she will likely enter exit house using rail and LUE around husbands shoulders as this has been an effective option since her initial fracture. Also explained option of scooting up stairs on her buttocks.  Wheelchair Mobility    Modified Rankin (Stroke Patients Only)       Balance Overall balance assessment: Needs assistance Sitting-balance support: No upper extremity supported;Single extremity supported Sitting balance-Leahy Scale: Good     Standing balance support: Single extremity supported;Bilateral upper extremity supported Standing balance-Leahy Scale: Fair                      Cognition Arousal/Alertness: Awake/alert Behavior During Therapy: WFL for tasks assessed/performed Overall Cognitive Status: Within Functional Limits for tasks assessed                      Exercises      General Comments General comments (skin integrity, edema, etc.): Reviewed safety with mobility.      Pertinent Vitals/Pain Pain Assessment: 0-10 Pain Score:  ("mild" no value given) Pain Location: Lt ankle Pain Descriptors / Indicators: Aching Pain Intervention(s): Monitored during session;Repositioned    Home Living Family/patient expects to be discharged to:: Private residence Living Arrangements: Spouse/significant other;Children Available Help at Discharge: Family;Available 24 hours/day Type of Home: House Home Access: Stairs to enter Entrance Stairs-Rails: Right;Left Home Layout: One level Home Equipment: Gilford Rile - 2 wheels;Crutches      Prior Function Level  of Independence: Independent          PT Goals (current goals can now be found in the care plan section) Acute Rehab PT Goals Patient Stated Goal: none stated PT Goal  Formulation: With patient Time For Goal Achievement: 11/19/14 Potential to Achieve Goals: Good Progress towards PT goals: Progressing toward goals    Frequency  Min 5X/week    PT Plan Current plan remains appropriate    Co-evaluation             End of Session Equipment Utilized During Treatment: Gait belt Activity Tolerance: Patient tolerated treatment well Patient left: in chair;with call bell/phone within reach     Time: 0849-0910 PT Time Calculation (min) (ACUTE ONLY): 21 min  Charges:  $Gait Training: 8-22 mins                    G Codes:      Ellouise Newer 11-12-14, 10:32 AM Elayne Snare, [place]

## 2014-11-06 NOTE — Care Management Note (Addendum)
Case Management Note  Patient Details  Name: Kristin Rose MRN: [id number] Date of Birth: 01/28/1961  Subjective/Objective:                    Action/Plan:  Added tub bench  Expected Discharge Date:       11-06-14            Expected Discharge Plan:  Home/Self Care  In-House Referral:     Discharge planning Services  CM Consult  Post Acute Care Choice:  Durable Medical Equipment Choice offered to:     DME Arranged:  3-N-1, Walker rolling DME Agency:  [place]:    [place]:     Status of Service:  Completed, signed off  Medicare Important Message Given:    Date Medicare IM Given:    Medicare IM give by:    Date Additional Medicare IM Given:    Additional Medicare Important Message give by:     If discussed at [place] of Stay Meetings, dates discussed:    Additional Comments:  Marilu Favre, RN 11/06/2014, 10:15 AM

## 2015-09-01 ENCOUNTER — Other Ambulatory Visit: Payer: Self-pay

## 2015-09-01 DIAGNOSIS — Z1231 Encounter for screening mammogram for malignant neoplasm of breast: Secondary | ICD-10-CM

## 2015-09-25 ENCOUNTER — Ambulatory Visit: Payer: Managed Care, Other (non HMO)

## 2015-09-25 ENCOUNTER — Ambulatory Visit
Admission: RE | Admit: 2015-09-25 | Discharge: 2015-09-25 | Disposition: A | Discharge status: Home / Self Care | Payer: 59 | Source: Ambulatory Visit

## 2015-09-25 DIAGNOSIS — Z1231 Encounter for screening mammogram for malignant neoplasm of breast: Secondary | ICD-10-CM

## 2017-05-09 DIAGNOSIS — R7303 Prediabetes: Secondary | ICD-10-CM

## 2017-05-09 HISTORY — DX: Prediabetes: R73.03

## 2017-07-26 ENCOUNTER — Other Ambulatory Visit: Payer: Self-pay | Admitting: Family Medicine

## 2017-07-26 DIAGNOSIS — Z1231 Encounter for screening mammogram for malignant neoplasm of breast: Secondary | ICD-10-CM

## 2017-08-15 ENCOUNTER — Ambulatory Visit
Admission: RE | Admit: 2017-08-15 | Discharge: 2017-08-15 | Disposition: A | Discharge status: Home / Self Care | Payer: No Typology Code available for payment source | Source: Ambulatory Visit | Attending: Family Medicine | Admitting: Family Medicine

## 2017-08-15 DIAGNOSIS — Z1231 Encounter for screening mammogram for malignant neoplasm of breast: Secondary | ICD-10-CM

## 2017-08-15 HISTORY — DX: Personal history of irradiation: Z92.3

## 2017-08-15 HISTORY — DX: Malignant neoplasm of unspecified site of unspecified female breast: C50.919

## 2017-08-22 ENCOUNTER — Other Ambulatory Visit (HOSPITAL_COMMUNITY)
Admission: RE | Admit: 2017-08-22 | Discharge: 2017-08-22 | Disposition: A | Discharge status: Home / Self Care | Payer: Medicaid Other | Source: Ambulatory Visit | Attending: Family Medicine | Admitting: Family Medicine

## 2017-08-22 ENCOUNTER — Other Ambulatory Visit: Payer: Self-pay | Admitting: Family Medicine

## 2017-08-22 DIAGNOSIS — Z01411 Encounter for gynecological examination (general) (routine) with abnormal findings: Secondary | ICD-10-CM | POA: Diagnosis not present

## 2017-08-23 LAB — CYTOLOGY - PAP: DIAGNOSIS: NEGATIVE

## 2018-05-21 ENCOUNTER — Encounter: Payer: Self-pay | Admitting: Internal Medicine

## 2018-05-21 ENCOUNTER — Ambulatory Visit: Payer: Self-pay | Admitting: Internal Medicine

## 2018-05-21 VITALS — BP 124/80 | HR 74 | Resp 12 | Ht 62.5 in | Wt 196.0 lb

## 2018-05-21 DIAGNOSIS — Z720 Tobacco use: Secondary | ICD-10-CM

## 2018-05-21 DIAGNOSIS — I10 Essential (primary) hypertension: Secondary | ICD-10-CM

## 2018-05-21 DIAGNOSIS — F32A Depression, unspecified: Secondary | ICD-10-CM

## 2018-05-21 DIAGNOSIS — F329 Major depressive disorder, single episode, unspecified: Secondary | ICD-10-CM

## 2018-05-21 DIAGNOSIS — Z9189 Other specified personal risk factors, not elsewhere classified: Secondary | ICD-10-CM

## 2018-05-21 DIAGNOSIS — R011 Cardiac murmur, unspecified: Secondary | ICD-10-CM | POA: Insufficient documentation

## 2018-05-21 DIAGNOSIS — F419 Anxiety disorder, unspecified: Secondary | ICD-10-CM

## 2018-05-21 DIAGNOSIS — R7303 Prediabetes: Secondary | ICD-10-CM

## 2018-05-21 DIAGNOSIS — Z716 Tobacco abuse counseling: Secondary | ICD-10-CM

## 2018-05-21 MED ORDER — MOMETASONE FUROATE 50 MCG/ACT NA SUSP: NASAL | 12 refills | Status: DC

## 2018-05-21 NOTE — Addendum Note (Signed)
Addended by: Marcelino Duster on: 05/21/2018 12:24 PM   Modules accepted: Orders

## 2018-05-21 NOTE — Patient Instructions (Signed)

## 2018-05-21 NOTE — Progress Notes (Addendum)
Subjective:    Patient ID: Kristin Rose, female    DOB: [birth year], 58 y.o.   MRN: [id number]  HPI   Here to establish  Previously seen at [facility].  Had blood work done there 7-8 months ago.    1.  Prediabetes:  She states an A1C sounds familiar, but uncertain what the number was.    Arises for day at 4-5 in a.m.:  Plays on laptop  May nap some more.  Up at 9 a.m. to start regular day.   May have water or water with Crystal Light. Sometimes coffee with flavored creamer and sugar.  1 cup only. May eat some peanuts or crackers or chips  2 p.m.:  Salad with shredded cheese and ranch dressing.  5-6 p.m.:  Trying to stay away from fried foods and pastas:  May have spaghetti and meatballs, garlic bread and salad with shrimp if has.  Snack of chips or crackers in the evening.  Canned tea, sweetened.   Pepsi every 1-2 weeks, 1 glass of Pepsi.    2.  Obesity:  States she lost 15 lbs since September.  Goes to [facility] and was weighed in today and was down 15 lbs.  States weighed 212 back in September. 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    3.  Smoking 1/2 ppd.  Anxiety issues trigger.  Being treated for this at [facility].  Takes Hydroxyzine for anxietly as well as Xanax. Lexapro has helped a lot when she was recently switched.       4.  Hypertension:  Was late with Amlodipine today. Took Metoprolol late as well. Also takes Losartan/HCTZ.  5.  HM:  Has not had flu vaccine  6.  Needs to see a dentist--can taste the bad taste in her mouth without a cleaning.  7.  Eyes do water and has allergic shiners.  Current Meds  Medication Sig  . ALPRAZolam (XANAX) 0.5 MG tablet 1/2 to 1 tab daily as needed  . amLODipine (NORVASC) 10 MG tablet Take 10 mg by mouth every morning.   Marland Kitchen aspirin-acetaminophen-caffeine (EXCEDRIN MIGRAINE) 250-250-65 MG per tablet Take 2 tablets by mouth every 8 (eight) hours as needed for  headache.   . Aspirin-Salicylamide-Caffeine (BC HEADACHE POWDER PO) Take 1 Package by mouth daily as needed (for pain).  Marland Kitchen escitalopram (LEXAPRO) 10 MG tablet Take 10 mg by mouth daily.  . hydrOXYzine (ATARAX/VISTARIL) 25 MG tablet Take 25 mg by mouth 3 (three) times daily as needed.  Marland Kitchen losartan-hydrochlorothiazide (HYZAAR) 100-25 MG per tablet Take 1 tablet by mouth every morning.  . metoprolol (LOPRESSOR) 100 MG tablet Take 100 mg by mouth 2 (two) times daily.  Marland Kitchen omeprazole (PRILOSEC) 20 MG capsule Take 20 mg by mouth daily.  . SUMAtriptan (IMITREX) 50 MG tablet Take 50 mg by mouth every 2 (two) hours as needed for migraine or headache. May repeat in 2 hours if headache persists or recurs.  Marland Kitchen zolpidem (AMBIEN) 10 MG tablet Take 10 mg by mouth at bedtime.     Allergies  Allergen Reactions  . Lisinopril Cough  . Percocet [Oxycodone-Acetaminophen] Itching    Past Medical History:  Diagnosis Date  . Breast cancer ([place])   . Cancer of left breast ([place]) 01/2004  . Depression   . GERD (gastroesophageal reflux disease)   . Heart murmur    as a child  . Hypertension   . Migraine    "  weekly" (11/06/2014)  . Personal history of radiation therapy   . Prediabetes 2019    Past Surgical History:  Procedure Laterality Date  . BREAST BIOPSY Left 01/2004  . BREAST LUMPECTOMY Left 01/2004  . COLONOSCOPY    . DILATION AND CURETTAGE OF UTERUS  1990's  . FRACTURE SURGERY  2016   Left ankle ORIf  . ORIF ANKLE FRACTURE Left 11/05/2014   Procedure: OPEN REDUCTION INTERNAL FIXATION (ORIF) LEFT BIMALLEOLAR ANKLE FRACTURE, SYNDESMOSIS;  Surgeon: Leandrew Koyanagi, MD;  Location: [facility];  Service: Orthopedics;  Laterality: Left;  . TUBAL LIGATION  1990's    Family History  Problem Relation Age of Onset  . Pulmonary embolism Mother   . Diabetes type II Mother   . Hypertension Mother   . Hypertension Father   . Glaucoma Father   . Lung cancer Father   . Breast cancer Sister   . Cancer Sister         Breast  . Diabetes Brother   . Schizophrenia Brother   . Diabetes Daughter        Obese  . Crohn's disease Brother   . Diabetes Brother     Social History   Socioeconomic History  . Marital status: Married    Spouse name: Not on file  . Number of children: Not on file  . Years of education: Not on file  . Highest education level: Not on file  Occupational History  . Occupation: Medical billing    Comment: unemployed  Social Needs  . Financial resource strain: Not on file  . Food insecurity:    Worry: Not on file    Inability: Not on file  . Transportation needs:    Medical: Not on file    Non-medical: Not on file  Tobacco Use  . Smoking status: Current Every Day Smoker    Packs/day: 0.50    Years: 30.00    Pack years: 15.00    Types: Cigarettes  . Smokeless tobacco: Never Used  Substance and Sexual Activity  . Alcohol use: Yes    Comment: "used to drink; stopped ~ 2010"  . Drug use: No  . Sexual activity: Not Currently    Birth control/protection: Condom  Lifestyle  . Physical activity:    Days per week: Not on file    Minutes per session: Not on file  . Stress: Not on file  Relationships  . Social connections:    Talks on phone: Not on file    Gets together: Not on file    Attends religious service: Not on file    Active member of club or organization: Not on file    Attends meetings of clubs or organizations: Not on file    Relationship status: Not on file  . Intimate partner violence:    Fear of current or ex partner: Not on file    Emotionally abused: Not on file    Physically abused: Not on file    Forced sexual activity: Not on file  Other Topics Concern  . Not on file  Social History Narrative   Lives with husband.   Used to walk a lot, but got away from her      Review of Systems     Objective:   Physical Exam NAD Obese HEENT:  PERRL EOMI, conjunctivae without injection.  Eyes continuously watering.  Skin is wrinkled, swollen and  hyperpigmented around her eyes as "allergic shiners"  Nasal mucosa mildly boggy, posterior pharynx with cobbling.  TMs pearly gray Neck:  Supple, No adenopathy, no thyromegaly Chest:  CTA CV:  RRR with normal S1 and S2, No S3 or S4.  I do not hear a murmur today.  Carotid, radial and DP pulses normal and equal Abd:  S, NT, No HSM or mass.  + BS LE: No edema.      Assessment & Plan:  1.  Prediabetes/obesity:  Went over diet and making small goals for diet and physical activity and write down on calendar.   May do this additively or all at once, whichever works for her. To find a walking  Buddy. FLP, A1C, CMP today.  2.  Hypertension:  Took meds late this morning.  Will recheck in follow up. CMA to recheck before discharge.  3.  Need for dental care:  Dental referral.  4.  Allergies:  Zyrtec 10 mg daily.  Will address pillow and mattress care and recommendations at follow up. Later:  Patient already with dry mouth issues with hydroxyzine.   Switch to Nasonex 2 sprays each nostril daily.  5.  Depression/anxiety:  Followed at [facility]

## 2018-05-22 LAB — COMPREHENSIVE METABOLIC PANEL
ALT: 19 IU/L (ref 0–32)
AST: 21 IU/L (ref 0–40)
Albumin/Globulin Ratio: 1.6 (ref 1.2–2.2)
Albumin: 4.6 g/dL (ref 3.5–5.5)
Alkaline Phosphatase: 176 IU/L — ABNORMAL HIGH (ref 39–117)
BILIRUBIN TOTAL: 0.5 mg/dL (ref 0.0–1.2)
BUN/Creatinine Ratio: 16 (ref 9–23)
BUN: 12 mg/dL (ref 6–24)
CO2: 23 mmol/L (ref 20–29)
Calcium: 9.7 mg/dL (ref 8.7–10.2)
Chloride: 99 mmol/L (ref 96–106)
Creatinine, Ser: 0.76 mg/dL (ref 0.57–1.00)
GFR calc Af Amer: 101 mL/min/{1.73_m2} (ref >59)
GFR calc non Af Amer: 87 mL/min/{1.73_m2} (ref >59)
Globulin, Total: 2.9 g/dL (ref 1.5–4.5)
Glucose: 79 mg/dL (ref 65–99)
Potassium: 4.1 mmol/L (ref 3.5–5.2)
Sodium: 140 mmol/L (ref 134–144)
TOTAL PROTEIN: 7.5 g/dL (ref 6.0–8.5)

## 2018-05-22 LAB — LIPID PANEL W/O CHOL/HDL RATIO
Cholesterol, Total: 200 mg/dL — ABNORMAL HIGH (ref 100–199)
HDL: 49 mg/dL (ref >39)
LDL Calculated: 125 mg/dL — ABNORMAL HIGH (ref 0–99)
Triglycerides: 129 mg/dL (ref 0–149)
VLDL Cholesterol Cal: 26 mg/dL (ref 5–40)

## 2018-05-22 LAB — HGB A1C W/O EAG: HEMOGLOBIN A1C: 5 % (ref 4.8–5.6)

## 2018-05-24 ENCOUNTER — Telehealth: Payer: Self-pay | Admitting: Internal Medicine

## 2018-05-24 NOTE — Telephone Encounter (Signed)
Social work Intern Kristin Rose [place] called Kristin Rose to welcome her to [facility] and inform her about counseling/mental health services that we provide. SWI left a message for her.

## 2018-07-03 ENCOUNTER — Ambulatory Visit: Payer: Medicaid Other | Admitting: Internal Medicine

## 2018-07-03 ENCOUNTER — Encounter: Payer: Self-pay | Admitting: Internal Medicine

## 2018-07-03 VITALS — BP 136/84 | HR 68 | Resp 12 | Ht 62.5 in | Wt 199.0 lb

## 2018-07-03 DIAGNOSIS — F329 Major depressive disorder, single episode, unspecified: Secondary | ICD-10-CM

## 2018-07-03 DIAGNOSIS — Z716 Tobacco abuse counseling: Secondary | ICD-10-CM

## 2018-07-03 DIAGNOSIS — G4700 Insomnia, unspecified: Secondary | ICD-10-CM

## 2018-07-03 DIAGNOSIS — Z6835 Body mass index (BMI) 35.0-35.9, adult: Secondary | ICD-10-CM

## 2018-07-03 DIAGNOSIS — R7303 Prediabetes: Secondary | ICD-10-CM | POA: Diagnosis not present

## 2018-07-03 DIAGNOSIS — F419 Anxiety disorder, unspecified: Secondary | ICD-10-CM

## 2018-07-03 DIAGNOSIS — I10 Essential (primary) hypertension: Secondary | ICD-10-CM

## 2018-07-03 DIAGNOSIS — F32A Depression, unspecified: Secondary | ICD-10-CM

## 2018-07-03 DIAGNOSIS — Z1239 Encounter for other screening for malignant neoplasm of breast: Secondary | ICD-10-CM

## 2018-07-03 MED ORDER — ZOLPIDEM TARTRATE 10 MG PO TABS: ORAL_TABLET | ORAL | 0 refills | Status: DC

## 2018-07-03 MED ORDER — OLOPATADINE HCL 0.2 % OP SOLN: OPHTHALMIC | 11 refills | Status: DC

## 2018-07-03 NOTE — Patient Instructions (Signed)
Drink a glass of water before every meal Drink 6-8 glasses of water daily Eat three meals daily Eat a protein and healthy fat with every meal (eggs,fish, chicken, [place] and limit red meats) Eat 5 servings of vegetables daily, mix the colors Eat 2 servings of fruit daily with skin, if skin is edible Use smaller plates Put food/utensils down as you chew and swallow each bite Eat at a table with friends/family at least once daily, no TV Do not eat in front of the TV  Recent studies show that people who consume all of their calories in a 12 hour period lose weight more efficiently.  For example, if you eat your first meal at 7:00 a.m., your last meal of the day should be completed by 7:00 p.m.  Regarding bedroom/bedclothes:  Start with getting down comforter out of room and continue to vacuum and clean bedding weekly. Then obtain the hypoallergenic pillow and mattress covers you will wipe down every week when you wash the sheets, etc. I can make a referral to [employer] for a [employer] Evaluation for the moisture in your home as well.  Insomnia:   Regular bedtime and wake time Calming activities prior to bedtime No caffeine after noon Daily physical activity and sunlight exposure--work up to 1 hour daily.  If possible, do physical activity 6 hours before bedtime. TV out of bedroom--avoid before sleep as well Keep bedroom cool Out of bed if has not initiated or reinitiated sleep in 20 minutes and read in quiet, comfortable place until sleepy, then return to bed Avoid daytime napping

## 2018-07-03 NOTE — Progress Notes (Signed)
Subjective:    Patient ID: Kristin Rose, female   DOB: [birth year], 58 y.o.   MRN: [id number]   HPI   1.  Prediabetes/obesity:  She is working now and feels she is more physically active due to this.  Weight, however, is up 3 lbs.  She continues to eat once daily and late at night before going to bed. A1C on recheck last month was normal at 5.0%.  2.  Allergies:  Feels Nasonex has helped.  Eyes are not as watery or itchy and eyelids not so dark. Right nostril occasionally with congestion. Does have what she thinks is a leak in bathroom ceiling of home she owns.  May have mold as well No roaches or other insects. Vacuums once weekly and washes sheets and blankets. Has numerous pillows on bed, non down containing Dose have a down comforter.   3. Insomnia:  Has used Ambien for 10 + years.  Has not tried any sleep behavior changes.  Has TV in bedroom and lies in bed when cannot fall asleep.   4.  Depression and Anxiety:  Using Xanax every other day.  She has been on 10 mg of Lexapro for 6 months and still requiring Xanax.     Current Meds  Medication Sig  . ALPRAZolam (XANAX) 0.5 MG tablet 1/2 to 1 tab daily as needed  . amLODipine (NORVASC) 10 MG tablet Take 10 mg by mouth every morning.   Marland Kitchen aspirin-acetaminophen-caffeine (EXCEDRIN MIGRAINE) 250-250-65 MG per tablet Take 2 tablets by mouth every 8 (eight) hours as needed for headache.   . Aspirin-Salicylamide-Caffeine (BC HEADACHE POWDER PO) Take 1 Package by mouth daily as needed (for pain).  Marland Kitchen escitalopram (LEXAPRO) 10 MG tablet Take 10 mg by mouth daily.  . hydrOXYzine (ATARAX/VISTARIL) 25 MG tablet Take 25 mg by mouth 3 (three) times daily as needed.  Marland Kitchen losartan-hydrochlorothiazide (HYZAAR) 100-25 MG per tablet Take 1 tablet by mouth every morning.  . metoprolol (LOPRESSOR) 100 MG tablet Take 100 mg by mouth 2 (two) times daily.  . mometasone (NASONEX) 50 MCG/ACT nasal spray 2 sprays each nostril daily  . omeprazole (PRILOSEC)  20 MG capsule Take 20 mg by mouth daily.  . SUMAtriptan (IMITREX) 50 MG tablet Take 50 mg by mouth every 2 (two) hours as needed for migraine or headache. May repeat in 2 hours if headache persists or recurs.  Marland Kitchen zolpidem (AMBIEN) 10 MG tablet Take 10 mg by mouth at bedtime.    Allergies  Allergen Reactions  . Lisinopril Cough  . Percocet [Oxycodone-Acetaminophen] Itching     Review of Systems    Objective:   BP 136/84 (BP Location: Left Arm, Patient Position: Sitting, Cuff Size: Normal)   Pulse 68   Resp 12   Ht 5' 2.5" (1.588 m)   Wt 199 lb (90.3 kg)   BMI 35.82 kg/m   Physical Exam  NAD HEENT:  PERRL, EOMI, conjunctivae without injection, but eyes still a bit watery.  Eyelids with thickening, mild swelling and hyperpigmentation.   TMs pearly gray, throat with small area of cobbling.  Nasal mucosa with mild bogginess and clear discharge. Neck:  Supple, No adenopathy Chest:  CTA CV:  RRR without murmur or rub.  Radial pulses normal and equal LE:  No edema.   Assessment & Plan   1.  Allergies:  Never obtained clear okay to refer to [facility] for Healthy Home referral. To keep down comforter out of room for 2-4 weeks and see  if makes a different. Then add in hypoallergenic pillow and mattress cover and wipe down weekly when washes sheets, etc.  2.  Prediabetes(resolved)/obesity:  Encouraged making small reachable goals and gradually adding.  Looking for weight loss of 1/2 to 1 lb weekly.  3.  Insomnia:  Went over healthy sleep hygiene.  Will prescribe Ambien only for short term.  To try and wean to 5 mg dosing as well.  4.  Anxiety/depression:  Discussed would not prescribe Xanax and do not recommend long term use.  Discussed should check with Beverly Sessions about the possibility of increasing her Lexapro to 20 mg instead if still having episodes of anxiety enough to require a benzodiazepine every other day.

## 2018-07-23 ENCOUNTER — Ambulatory Visit: Payer: Self-pay | Admitting: Internal Medicine

## 2018-07-23 ENCOUNTER — Other Ambulatory Visit: Payer: Self-pay | Admitting: Internal Medicine

## 2018-07-23 NOTE — Telephone Encounter (Signed)
To Dr. Mulberry for approval 

## 2018-08-02 ENCOUNTER — Telehealth: Payer: Self-pay

## 2018-08-02 MED ORDER — OMEPRAZOLE 20 MG PO CPDR: 20.0000 mg | DELAYED_RELEASE_CAPSULE | Freq: Every day | ORAL | 4 refills | Status: DC

## 2018-08-02 MED ORDER — OLOPATADINE HCL 0.7 % OP SOLN: 1.0000 [drp] | Freq: Every day | OPHTHALMIC | 11 refills | Status: AC

## 2018-08-02 NOTE — Telephone Encounter (Signed)
Kristin Rose from [facility] called asking for new Rx for Pazeo instead of pataday. No longer available thru patient assistance. Patient also requested Rx for omeprazole. Patient ha already been taking this medication. Per Dr. Amil Amen ok for patient to have both. New Rx's sent to [facility].

## 2018-08-03 ENCOUNTER — Ambulatory Visit (INDEPENDENT_AMBULATORY_CARE_PROVIDER_SITE_OTHER): Payer: Medicaid Other | Admitting: Internal Medicine

## 2018-08-03 ENCOUNTER — Other Ambulatory Visit: Payer: Self-pay

## 2018-08-03 VITALS — HR 60 | Temp 98.7°F | Resp 12

## 2018-08-03 DIAGNOSIS — R509 Fever, unspecified: Secondary | ICD-10-CM | POA: Diagnosis not present

## 2018-08-03 LAB — POCT INFLUENZA A/B
Influenza A, POC: NEGATIVE
Influenza B, POC: NEGATIVE

## 2018-08-03 NOTE — Progress Notes (Signed)
    Subjective:    Patient ID: Kristin Rose, female   DOB: 28-Jul-1960, 58 y.o.   MRN: [id number]   HPI  Outdoor car visit.    Patient screened at [facility] when arrived to pick up meds at the pharmacy there. Found to have a temp of 101 orally.  She did not have symptoms at the time. Called in today to discuss She took [employer] today, but no symptoms of fever. 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 She denies any symptoms of fever, cough, maybe a bit of a sore throat, but also dealing with allergies currently.  She also is not sure how much of the sore throat she is imagining as she is scared of developing COVID 19. She does have a husband over 26 at home who has CAD.    No outpatient medications have been marked as taking for the 08/03/18 encounter (Office Visit) with Mack Hook, MD.   Allergies  Allergen Reactions  . Lisinopril Cough  . Percocet [Oxycodone-Acetaminophen] Itching     Review of Systems    Objective:   Pulse 60   Temp 98.7 F (37.1 C) (Oral)   Resp 12   SpO2 98%   Physical Exam  HEENT:  Allergic shiners bilaterally Lungs:  CTA CV:  RRR without murmur or rub.   Assessment & Plan   1.  Reported fever with oral check at [facility] yesterday.  No definite symptoms. Patient tested negative with rapid Influenza A and B testing today. She is to isolate at home, separately from her husband.  Gave her 2 masks and a pair of gloves.   If she is still without symptoms in 7 days, can rejoin her husband in the household, but to still stay at home. If she develops chest discomfort, shortness of breath, discussed she needs to call and will likely need to be seen in ED. Tylenol only for fever, myalgias. Progress report to be called to clinic phone daily. She has a history of allergies and prediabetes.

## 2018-08-06 ENCOUNTER — Telehealth: Payer: Self-pay

## 2018-08-06 NOTE — Telephone Encounter (Signed)
Encourage her to find thing to do maybe inside with all the pollen out right now. Read a book, meditation, video exercises, deep breathing exercises, listen to calming music.  Avoid a lot of listening to the news or going on line and reading about the coronavirus info if it makes her anxious.   If it rains this week, to get outside afterward perhaps when pollen count down, but keep her physical distance of 6 feet. Give [place] a follow up call end of week please.

## 2018-08-06 NOTE — Telephone Encounter (Signed)
Patient called in giving update on how she is feeling. Patient states she could not fins a thermometer anywhere so she ordered one online and she is waiting for it to arrive. Patient states she does not feel hot and denies any cough, vomiting, diarrhea or headache. Patient states she has a tickle in her throat but she believes it is coming from her allergies. States she feels ok but she is anxious from everything going on right now.  To Dr. Jodelle Green

## 2018-08-07 ENCOUNTER — Other Ambulatory Visit: Payer: Self-pay | Admitting: Internal Medicine

## 2018-08-07 MED ORDER — ZOLPIDEM TARTRATE 5 MG PO TABS: ORAL_TABLET | ORAL | 0 refills | Status: DC

## 2018-08-07 NOTE — Telephone Encounter (Signed)
Spoke with patient. States she would like it sent to [facility]. Patient verbalized understanding that this will be her last refill.

## 2018-08-07 NOTE — Telephone Encounter (Signed)
Spoke with patient information given. Patient states she still feels the same as she did yesterday, but she did have a death in her family yesterday and has been up all night and unable to sleep. Patient asking for Lorrin Mais to help her sleep. Patient states she is willing to even do 1/2 the dose just to get some sleep.   To Dr. Amil Amen for further direction.

## 2018-08-07 NOTE — Telephone Encounter (Signed)
As she did not cut back on dosage as discussed, will be sending in 5 mg tabs x 10.  This will be the last refill. Would like for her to work on activities to improve sleep as we discussed at the visit.

## 2018-10-23 ENCOUNTER — Ambulatory Visit
Admission: RE | Admit: 2018-10-23 | Discharge: 2018-10-23 | Disposition: A | Discharge status: Home / Self Care | Payer: Self-pay | Source: Ambulatory Visit | Attending: Internal Medicine | Admitting: Internal Medicine

## 2018-10-23 ENCOUNTER — Other Ambulatory Visit: Payer: Self-pay

## 2018-10-23 DIAGNOSIS — Z1239 Encounter for other screening for malignant neoplasm of breast: Secondary | ICD-10-CM

## 2018-11-02 ENCOUNTER — Ambulatory Visit: Payer: Medicaid Other | Admitting: Internal Medicine

## 2018-11-02 ENCOUNTER — Encounter: Payer: Self-pay | Admitting: Internal Medicine

## 2018-11-02 ENCOUNTER — Other Ambulatory Visit: Payer: Self-pay

## 2018-11-02 VITALS — BP 138/88 | HR 66 | Resp 12 | Ht 62.5 in | Wt 184.0 lb

## 2018-11-02 DIAGNOSIS — G4700 Insomnia, unspecified: Secondary | ICD-10-CM

## 2018-11-02 DIAGNOSIS — R7303 Prediabetes: Secondary | ICD-10-CM

## 2018-11-02 DIAGNOSIS — E669 Obesity, unspecified: Secondary | ICD-10-CM

## 2018-11-02 DIAGNOSIS — G5603 Carpal tunnel syndrome, bilateral upper limbs: Secondary | ICD-10-CM

## 2018-11-02 DIAGNOSIS — F419 Anxiety disorder, unspecified: Secondary | ICD-10-CM

## 2018-11-02 DIAGNOSIS — M65341 Trigger finger, right ring finger: Secondary | ICD-10-CM

## 2018-11-02 DIAGNOSIS — F32A Depression, unspecified: Secondary | ICD-10-CM

## 2018-11-02 DIAGNOSIS — F329 Major depressive disorder, single episode, unspecified: Secondary | ICD-10-CM

## 2018-11-02 DIAGNOSIS — Z6833 Body mass index (BMI) 33.0-33.9, adult: Secondary | ICD-10-CM

## 2018-11-02 DIAGNOSIS — I10 Essential (primary) hypertension: Secondary | ICD-10-CM

## 2018-11-02 NOTE — Patient Instructions (Signed)

## 2018-11-02 NOTE — Progress Notes (Signed)
    Subjective:    Patient ID: Kristin Rose, female   DOB: [birth year], 58 y.o.   MRN: [id number]   HPI   1.  Prediabetes/obesity:  Was down to a normal A1C in January.  Down 15 lbs from February. 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  2.  Insomnia:  Not as bad, but still a problem.  Has not tried many of the recommendations.  3.  Recurrent vaginal yeast infections.  Has not had in past 3 weeks.  Using Always pantiliners.  4.  Right middle and ring finger with triggering.  Left fingers starting.  Hands go numb at times as well.  Flexes at wrists when sleeps.  5.  Depression and anxiety doing well without increase in Lexapro.  Never called [facility] about increasing dose.    Current Meds  Medication Sig  . amLODipine (NORVASC) 10 MG tablet Take 10 mg by mouth every morning.   . Aspirin-Salicylamide-Caffeine (BC HEADACHE POWDER PO) Take 1 Package by mouth daily as needed (for pain).  Marland Kitchen escitalopram (LEXAPRO) 10 MG tablet Take 10 mg by mouth daily.  . hydrOXYzine (ATARAX/VISTARIL) 25 MG tablet Take 25 mg by mouth 3 (three) times daily as needed.  Marland Kitchen losartan-hydrochlorothiazide (HYZAAR) 100-25 MG per tablet Take 1 tablet by mouth every morning.  . metoprolol (LOPRESSOR) 100 MG tablet Take 100 mg by mouth 2 (two) times daily.  . mometasone (NASONEX) 50 MCG/ACT nasal spray 2 sprays each nostril daily  . Olopatadine HCl (PAZEO) 0.7 % SOLN Apply 1 drop to eye daily. 1 drop in each eye daily  . omeprazole (PRILOSEC) 20 MG capsule Take 1 capsule (20 mg total) by mouth daily.  . SUMAtriptan (IMITREX) 50 MG tablet Take 50 mg by mouth every 2 (two) hours as needed for migraine or headache. May repeat in 2 hours if headache persists or recurs.   Allergies  Allergen Reactions  . Lisinopril Cough  . Percocet [Oxycodone-Acetaminophen] Itching     Review of Systems    Objective:   BP 138/88 (BP Location: Left Arm, Patient Position: Sitting, Cuff Size: Normal)   Pulse 66   Resp 12   Ht  5' 2.5" (1.588 m)   Wt 184 lb (83.5 kg)   LMP  (LMP Unknown)   BMI 33.12 kg/m   Physical Exam  NAD HEENT: PERRL, EOMI,  Neck:  Supple, No adenopathy, no thyromegaly Chest:  CTA CV: RRR without murmur or rub.  Radial pulses normal and equal. Abd:  S, NT, No HSM or mass, + BS LE:  No edema Hands:  Swelling at base of right 3rd and 4th digits/palmar.  Not on left hand.  Not able to induce triggering today.  Assessment & Plan  1.  Prediabetes/obesity:  improved with lifestyle changes.  Discussed diet/physical activity.  2.  Insomnia:  Encouraged working on improving sleep hygiene as before.  3.  Recurrent Yeast infections:  Improved.  Discussed would not use pantiliners, particularly Always, due to contact irritation with those.  4.  Probable trigger fingers of middle and ring fingers of right hand.  Recommended volar hand splint that extends beyond MCP joint for 2 weeks  If worsens, can perform injection and splinting.  To avoid trying to trigger the fingers.  5.  Anxiety and depression:  Feels she is doing well on current dosing of Lexapro.

## 2018-12-12 ENCOUNTER — Other Ambulatory Visit: Payer: Self-pay

## 2018-12-12 DIAGNOSIS — E559 Vitamin D deficiency, unspecified: Secondary | ICD-10-CM

## 2018-12-12 DIAGNOSIS — D0510 Intraductal carcinoma in situ of unspecified breast: Secondary | ICD-10-CM

## 2018-12-12 MED ORDER — METOPROLOL TARTRATE 100 MG PO TABS: 100.0000 mg | ORAL_TABLET | Freq: Two times a day (BID) | ORAL | 10 refills | Status: DC

## 2018-12-12 MED ORDER — AMLODIPINE BESYLATE 10 MG PO TABS: 10.0000 mg | ORAL_TABLET | Freq: Every morning | ORAL | 10 refills | Status: DC

## 2018-12-12 MED ORDER — LOSARTAN POTASSIUM-HCTZ 100-25 MG PO TABS: 1.0000 | ORAL_TABLET | Freq: Every morning | ORAL | 10 refills | Status: DC

## 2019-02-21 ENCOUNTER — Other Ambulatory Visit: Payer: Self-pay | Admitting: Internal Medicine

## 2019-03-04 ENCOUNTER — Telehealth: Payer: Self-pay | Admitting: Internal Medicine

## 2019-03-04 ENCOUNTER — Encounter: Payer: Self-pay | Admitting: Internal Medicine

## 2019-03-04 ENCOUNTER — Other Ambulatory Visit: Payer: Self-pay

## 2019-03-04 ENCOUNTER — Ambulatory Visit (INDEPENDENT_AMBULATORY_CARE_PROVIDER_SITE_OTHER): Payer: Self-pay | Admitting: Internal Medicine

## 2019-03-04 VITALS — BP 180/88 | HR 78 | Temp 97.1°F | Resp 12 | Ht 62.5 in | Wt 172.0 lb

## 2019-03-04 DIAGNOSIS — I10 Essential (primary) hypertension: Secondary | ICD-10-CM

## 2019-03-04 DIAGNOSIS — Z720 Tobacco use: Secondary | ICD-10-CM

## 2019-03-04 DIAGNOSIS — Z79899 Other long term (current) drug therapy: Secondary | ICD-10-CM

## 2019-03-04 DIAGNOSIS — B353 Tinea pedis: Secondary | ICD-10-CM

## 2019-03-04 DIAGNOSIS — Z114 Encounter for screening for human immunodeficiency virus [HIV]: Secondary | ICD-10-CM

## 2019-03-04 DIAGNOSIS — Z23 Encounter for immunization: Secondary | ICD-10-CM

## 2019-03-04 DIAGNOSIS — Z0000 Encounter for general adult medical examination without abnormal findings: Secondary | ICD-10-CM

## 2019-03-04 DIAGNOSIS — B351 Tinea unguium: Secondary | ICD-10-CM

## 2019-03-04 DIAGNOSIS — E010 Iodine-deficiency related diffuse (endemic) goiter: Secondary | ICD-10-CM

## 2019-03-04 DIAGNOSIS — K029 Dental caries, unspecified: Secondary | ICD-10-CM

## 2019-03-04 DIAGNOSIS — Z1159 Encounter for screening for other viral diseases: Secondary | ICD-10-CM

## 2019-03-04 DIAGNOSIS — H43393 Other vitreous opacities, bilateral: Secondary | ICD-10-CM

## 2019-03-04 DIAGNOSIS — Z716 Tobacco abuse counseling: Secondary | ICD-10-CM

## 2019-03-04 DIAGNOSIS — E7800 Pure hypercholesterolemia, unspecified: Secondary | ICD-10-CM

## 2019-03-04 MED ORDER — SUMATRIPTAN SUCCINATE 50 MG PO TABS: ORAL_TABLET | ORAL | 6 refills | Status: DC

## 2019-03-04 NOTE — Telephone Encounter (Signed)
Spoke with Aldona Bar regarding patient tooth removal. Per Aldona Bar "Shelly called back earlier in April or May telling she was going to go elsewhere to have her teeth extracted. I have called numerous times since we've been open to get in contact with her and either leave a vm or I have left messages with a gentleman who tells she is not available. I'll be happy to scheduled her if I could just get in contact with her." Called pt. And shared this information with her and advise to answer any phone calls including numbers that she does not recognize. Also, provide Samantha with all numbers listed on pt. Chart.

## 2019-03-04 NOTE — Progress Notes (Signed)
Subjective:    Patient ID: Kristin Rose, female   DOB: 12-22-1960, 58 y.o.   MRN: [id number]   HPI   CPE without pap  1.  Pap:  Last 08/22/2017 and normal  2.  Mammogram:  Last 10/23/2018 and normal.  Sister who is 52 was diagnosed at age 42 yo.  Patient with personal history of breast cancer.  3.  Osteoprevention:  Not much in way of dairy.  Is very physically active.  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  4.  Guaiac Cards:  Never.  5.  Colonoscopy:  She believes in 2012.  No polyps.  [place]  6.  Immunizations:   Immunization History  Administered Date(s) Administered  . Influenza-Unspecified 03/01/2019  . Pneumococcal-Unspecified 05/09/2014     7.  Glucose/Cholesterol:  History of prediabetes, though A1C was 5.0% in January and mild hypercholesterolemia.  Has lost 25 lbs since 2/20202.   Lipid Panel     Component Value Date/Time   CHOL 200 (H) 05/21/2018 1201   TRIG 129 05/21/2018 1201   HDL 49 05/21/2018 1201   LDLCALC 125 (H) 05/21/2018 1201   LABVLDL 26 05/21/2018 1201      Current Meds  Medication Sig  . amLODipine (NORVASC) 10 MG tablet Take 1 tablet (10 mg total) by mouth every morning.  Marland Kitchen aspirin-acetaminophen-caffeine (EXCEDRIN MIGRAINE) 250-250-65 MG per tablet Take 2 tablets by mouth every 8 (eight) hours as needed for headache.   . Aspirin-Salicylamide-Caffeine (BC HEADACHE POWDER PO) Take 1 Package by mouth daily as needed (for pain).  Marland Kitchen escitalopram (LEXAPRO) 10 MG tablet Take 10 mg by mouth daily.  Marland Kitchen losartan-hydrochlorothiazide (HYZAAR) 100-25 MG tablet Take 1 tablet by mouth every morning.  . metoprolol tartrate (LOPRESSOR) 100 MG tablet Take 1 tablet (100 mg total) by mouth 2 (two) times daily.  . mometasone (NASONEX) 50 MCG/ACT nasal spray 2 sprays each nostril daily  . Olopatadine HCl (PAZEO) 0.7 % SOLN Apply 1 drop to eye daily. 1 drop in each eye daily  . omeprazole (PRILOSEC) 20 MG capsule TAKE (1)  CAPSULE BY MOUTH ONCE A DAY   Allergies  Allergen Reactions  . Lisinopril Cough  . Percocet [Oxycodone-Acetaminophen] Itching   Past Medical History:  Diagnosis Date  . Cancer of left breast [facility]) 01/2004   Treatment with lumpectomy, radiation and in 2008 finished with Tamoxifen.  . Depression   . GERD (gastroesophageal reflux disease)   . Heart murmur    as a child  . Hypertension   . Migraine    "weekly" (11/06/2014)  . Personal history of radiation therapy   . Prediabetes 2019   Past Surgical History:  Procedure Laterality Date  . BREAST BIOPSY Left 01/2004  . BREAST LUMPECTOMY Left 01/2004  . COLONOSCOPY    . DILATION AND CURETTAGE OF UTERUS  1990's  . FRACTURE SURGERY  2016   Left ankle ORIf  . ORIF ANKLE FRACTURE Left 11/05/2014   Procedure: OPEN REDUCTION INTERNAL FIXATION (ORIF) LEFT BIMALLEOLAR ANKLE FRACTURE, SYNDESMOSIS;  Surgeon: Leandrew Koyanagi, MD;  Location: [place];  Service: Orthopedics;  Laterality: Left;  . TUBAL LIGATION  1990's   Family History  Problem Relation Age of Onset  . Pulmonary embolism Mother   . Diabetes type II Mother   . Hypertension Mother   . Hypertension Father   . Glaucoma Father   . Lung cancer Father        2005  . Cancer Sister 66  Breast  . Diabetes Brother   . Schizophrenia Brother   . Diabetes Daughter        Obese  . Crohn's disease Brother   . Diabetes Brother     Social History   Socioeconomic History  . Marital status: Married    Spouse name: Hjordis Howley  . Number of children: 2  . Years of education: 64  . Highest education level: High school graduate  Occupational History  . Occupation: Marine scientist work  Scientific laboratory technician  . Financial resource strain: Not very hard  . Food insecurity    Worry: Never true    Inability: Never true  . Transportation needs    Medical: No    Non-medical: No  Tobacco Use  . Smoking status: Current Every Day Smoker    Packs/day: 0.50    Years: 30.00    Pack years: 15.00     Types: Cigarettes  . Smokeless tobacco: Never Used  . Tobacco comment: Has quit with Chantix before--quit for up to 2 years and then goes back to smoking during stress.  Substance and Sexual Activity  . Alcohol use: Yes    Comment: "used to drink; stopped ~ 2010"  . Drug use: No  . Sexual activity: Not Currently    Birth control/protection: Surgical  Lifestyle  . Physical activity    Days per week: Not on file    Minutes per session: Not on file  . Stress: Not on file  Relationships  . Social Herbalist on phone: Not on file    Gets together: Not on file    Attends religious service: Not on file    Active member of club or organization: Not on file    Attends meetings of clubs or organizations: Not on file    Relationship status: Married  . Intimate partner violence    Fear of current or ex partner: No    Emotionally abused: No    Physically abused: No    Forced sexual activity: No  Other Topics Concern  . Not on file  Social History Narrative   Lives with husband.   Husband disabled   She is doing well now that she is working (2020)     Review of Systems  Constitutional: Positive for activity change (Much more activity). Negative for fatigue, fever and unexpected weight change (Has lost 25 lbs since Feb purposely.).  HENT: Positive for dental problem (Already in dental clinic queue, but has not heard back since March.). Negative for hearing loss, rhinorrhea and sore throat.   Eyes: Positive for visual disturbance (Floaters).  Respiratory: Negative for cough and shortness of breath.   Cardiovascular: Negative for chest pain, palpitations and leg swelling.  Gastrointestinal: Negative for abdominal pain, blood in stool (no melena.), constipation and diarrhea.  Genitourinary: Negative for dysuria and vaginal discharge.  Musculoskeletal:       Leg aching after work.  Generally at night when lying down.  Down sides of thighs and into bilateral lower  legs/gastroc/soleus areas.  Skin: Positive for rash (Underneath breasts.  Was sweating a lot there.  ).  Neurological: Positive for numbness (Cock up splint with right hand has helped.  2nd toes on both feet at times get numb.  All at night.  No recent history of wearing high heels. ).  Psychiatric/Behavioral:       Depression improved.   Not as sad. Meds changes to Lexapro.   Getting around more with work has helped.  Her anxiety is much better.        Objective:   BP (!) 180/88 (BP Location: Left Arm, Patient Position: Sitting, Cuff Size: Normal)   Pulse 78   Temp (!) 97.1 F (36.2 C) (Temporal)   Resp 12   Ht 5' 2.5" (1.588 m)   Wt 172 lb (78 kg)   LMP  (LMP Unknown)   BMI 30.96 kg/m   Physical Exam  Constitutional: She is oriented to person, place, and time. She appears well-developed and well-nourished.  HENT:  Head: Normocephalic and atraumatic.  Right Ear: Hearing, tympanic membrane, external ear and ear canal normal.  Left Ear: Hearing, tympanic membrane, external ear and ear canal normal.  Nose: Nose normal.  Mouth/Throat: Uvula is midline, oropharynx is clear and moist and mucous membranes are normal.  Eyes: Pupils are equal, round, and reactive to light. Conjunctivae and EOM are normal.  Small cataracts bilaterally.  Neck: Normal range of motion and full passive range of motion without pain. Neck supple. Thyromegaly present. No thyroid mass present.  Cardiovascular: Normal rate, regular rhythm, S1 normal and S2 normal. Exam reveals no S3, no S4 and no friction rub.  No murmur heard. No carotid bruits.  Carotid, radial, femoral, DP and PT pulses normal and equal.   Pulmonary/Chest: Effort normal and breath sounds normal. She has no rales. Right breast exhibits no inverted nipple, no mass, no nipple discharge and no tenderness. Left breast exhibits skin change (Loose skin of medial breast where lumpectomy performed with scarring.). Left breast exhibits no inverted  nipple, no mass, no nipple discharge and no tenderness.  Abdominal: Soft. Bowel sounds are normal. She exhibits no mass. There is no hepatosplenomegaly. There is no abdominal tenderness. No hernia.  Genitourinary:    Genitourinary Comments: Normal external female genitalia.  No lesions.  No vaginal discharge. No uterine or adnexal mass or tenderness.  Bimanual exam limited due to vaginal atrophy Rectal:  No mass, heme negative stool.   Musculoskeletal: Normal range of motion.  Lymphadenopathy:       Head (right side): No submental and no submandibular adenopathy present.       Head (left side): No submental and no submandibular adenopathy present.    She has no cervical adenopathy.    She has no axillary adenopathy.       Right: No inguinal and no supraclavicular adenopathy present.       Left: No inguinal and no supraclavicular adenopathy present.  Neurological: She is alert and oriented to person, place, and time. She has normal strength and normal reflexes. No cranial nerve deficit or sensory deficit. Coordination and gait normal.  Skin: Skin is warm. No rash noted.  Plantar aspects of feet with thickening and flaking.  Some toenails with discoloration and thickening--very mild.    Psychiatric: She has a normal mood and affect. Her speech is normal and behavior is normal. Judgment and thought content normal. Cognition and memory are normal.      Assessment & Plan  1.  CPE without pap Mammogram done in June and normal. CBC, CMP, FLP, HIV, Hep C Up to date on influenza vaccine. Tdap Guaiac cards x 3, to return in 2 weeks.  2.  Thyromegaly:  TSH  3.  Obesity:  Great lifestyle changes.  Continue.  4.  Tobacco abuse:  She will let me know when she is ready to try an intervention.  5.  Migraines:  Refilled Sumatriptan.  6.  Dyslipidemia:  FLP  7.  Dental Decay--re refer as not making headway getting in to dental clinic.  8.  Tinea pedis/toenail onychomycosis:  The latter is  mild:  Chooses topical for now:  Terbinafine cream twice daily for 14 days or more, then Gold bond foot cream with tea tree oil.    9.  Hypertension:  Anxious for exam.  To return in next 2 weeks for nurse visit with her home monitor to compare and recheck.  States not missing meds.  Has lost 25 lbs!

## 2019-03-04 NOTE — Patient Instructions (Addendum)
[facility] [id number] [place], [place]  [address] Eye exam and 2 pair of eyeglasses for $69.95  Can google "advance directives, [place]"  And bring up form from Secretary of [place]. Print and fill out Or can go to "5 wishes"  Which is also in Spanish and fill out--this costs $5--perhaps easier to use. Designate a Forensic scientist to speak for you if you are unable to speak for yourself when ill or injured  Terbinafine cream in foot care aisle--apply to feet twice daily for 14 days or longer if still with flaking.

## 2019-03-05 LAB — CBC WITH DIFFERENTIAL/PLATELET
Basophils Absolute: 0 10*3/uL (ref 0.0–0.2)
Basos: 1 %
EOS (ABSOLUTE): 0.1 10*3/uL (ref 0.0–0.4)
Eos: 2 %
Hematocrit: 39.2 % (ref 34.0–46.6)
Hemoglobin: 13.2 g/dL (ref 11.1–15.9)
Immature Grans (Abs): 0 10*3/uL (ref 0.0–0.1)
Immature Granulocytes: 0 %
Lymphocytes Absolute: 2.4 10*3/uL (ref 0.7–3.1)
Lymphs: 38 %
MCH: 33.8 pg — ABNORMAL HIGH (ref 26.6–33.0)
MCHC: 33.7 g/dL (ref 31.5–35.7)
MCV: 100 fL — ABNORMAL HIGH (ref 79–97)
Monocytes Absolute: 0.4 10*3/uL (ref 0.1–0.9)
Monocytes: 6 %
Neutrophils Absolute: 3.4 10*3/uL (ref 1.4–7.0)
Neutrophils: 53 %
Platelets: 319 10*3/uL (ref 150–450)
RBC: 3.91 x10E6/uL (ref 3.77–5.28)
RDW: 11.3 % — ABNORMAL LOW (ref 11.7–15.4)
WBC: 6.3 10*3/uL (ref 3.4–10.8)

## 2019-03-05 LAB — LIPID PANEL W/O CHOL/HDL RATIO
Cholesterol, Total: 176 mg/dL (ref 100–199)
HDL: 54 mg/dL (ref >39)
LDL Chol Calc (NIH): 105 mg/dL — ABNORMAL HIGH (ref 0–99)
Triglycerides: 94 mg/dL (ref 0–149)
VLDL Cholesterol Cal: 17 mg/dL (ref 5–40)

## 2019-03-05 LAB — COMPREHENSIVE METABOLIC PANEL
ALT: 21 IU/L (ref 0–32)
AST: 22 IU/L (ref 0–40)
Albumin/Globulin Ratio: 1.7 (ref 1.2–2.2)
Albumin: 4.5 g/dL (ref 3.8–4.9)
Alkaline Phosphatase: 143 IU/L — ABNORMAL HIGH (ref 39–117)
BUN/Creatinine Ratio: 19 (ref 9–23)
BUN: 13 mg/dL (ref 6–24)
Bilirubin Total: 0.4 mg/dL (ref 0.0–1.2)
CO2: 26 mmol/L (ref 20–29)
Calcium: 9.8 mg/dL (ref 8.7–10.2)
Chloride: 103 mmol/L (ref 96–106)
Creatinine, Ser: 0.68 mg/dL (ref 0.57–1.00)
GFR calc Af Amer: 112 mL/min/{1.73_m2} (ref >59)
GFR calc non Af Amer: 97 mL/min/{1.73_m2} (ref >59)
Globulin, Total: 2.6 g/dL (ref 1.5–4.5)
Glucose: 83 mg/dL (ref 65–99)
Potassium: 3.8 mmol/L (ref 3.5–5.2)
Sodium: 144 mmol/L (ref 134–144)
Total Protein: 7.1 g/dL (ref 6.0–8.5)

## 2019-03-05 LAB — HEPATITIS C ANTIBODY: Hep C Virus Ab: <0.1 s/co ratio (ref 0.0–0.9)

## 2019-03-05 LAB — HIV ANTIBODY (ROUTINE TESTING W REFLEX): HIV Screen 4th Generation wRfx: NONREACTIVE

## 2019-03-07 NOTE — Telephone Encounter (Signed)
noted 

## 2019-03-08 ENCOUNTER — Telehealth: Payer: Self-pay | Admitting: Internal Medicine

## 2019-03-08 NOTE — Telephone Encounter (Signed)
Pt. States Dr. Amil Amen advise her to call in today with blood pressure readings. Pt. Readings were taking at home today at around 2:12pm  1st reading--- 149/107 PULSE  75 2nd reading---155/106 PULSE 74 3rd reading----143/100 PULSE 77  Pt. States readings were taking 5 minutes apart

## 2019-03-10 LAB — SPECIMEN STATUS REPORT

## 2019-03-10 LAB — FOLATE: Folate: 9.5 ng/mL (ref >3.0)

## 2019-03-10 LAB — VITAMIN B12: Vitamin B-12: 717 pg/mL (ref 232–1245)

## 2019-03-12 NOTE — Telephone Encounter (Signed)
Can she take her bp once daily at different times?  Is she missing her meds at all?  Is on 3 different bp meds.

## 2019-03-12 NOTE — Telephone Encounter (Signed)
Spoke with patient regarding BP reading and message from Dr. Amil Amen. Patient states she was not taking her metoprolol at night but she will start taking it tonight. Informed Dr. Amil Amen. Asked that patient take her BP at different times of of day once a day. Per Dr. Amil Amen have patient call in with 2 weeks of BP reading. taking BP 3 times a week at different times of the day. Message given to patient she verbalized understanding and states she will with 6 readings in 2 weeks.

## 2019-03-27 LAB — TSH: TSH: 1.25 u[IU]/mL (ref 0.450–4.500)

## 2019-03-27 LAB — SPECIMEN STATUS REPORT

## 2019-04-16 ENCOUNTER — Other Ambulatory Visit: Payer: Self-pay | Admitting: Internal Medicine

## 2019-05-28 ENCOUNTER — Other Ambulatory Visit: Payer: Self-pay | Admitting: Internal Medicine

## 2019-07-08 ENCOUNTER — Other Ambulatory Visit: Payer: Self-pay | Admitting: Internal Medicine

## 2019-07-22 ENCOUNTER — Other Ambulatory Visit: Payer: Self-pay | Admitting: Internal Medicine

## 2019-09-01 ENCOUNTER — Ambulatory Visit (HOSPITAL_COMMUNITY)
Admission: EM | Admit: 2019-09-01 | Discharge: 2019-09-01 | Disposition: A | Discharge status: Home / Self Care | Payer: Medicaid Other | Attending: Family Medicine | Admitting: Family Medicine

## 2019-09-01 ENCOUNTER — Encounter (HOSPITAL_COMMUNITY): Payer: Self-pay | Admitting: Family Medicine

## 2019-09-01 ENCOUNTER — Other Ambulatory Visit: Payer: Self-pay

## 2019-09-01 DIAGNOSIS — I10 Essential (primary) hypertension: Secondary | ICD-10-CM | POA: Diagnosis not present

## 2019-09-01 DIAGNOSIS — Z885 Allergy status to narcotic agent status: Secondary | ICD-10-CM | POA: Insufficient documentation

## 2019-09-01 DIAGNOSIS — Z888 Allergy status to other drugs, medicaments and biological substances status: Secondary | ICD-10-CM | POA: Diagnosis not present

## 2019-09-01 DIAGNOSIS — F329 Major depressive disorder, single episode, unspecified: Secondary | ICD-10-CM | POA: Diagnosis not present

## 2019-09-01 DIAGNOSIS — Z79899 Other long term (current) drug therapy: Secondary | ICD-10-CM | POA: Diagnosis not present

## 2019-09-01 DIAGNOSIS — R1110 Vomiting, unspecified: Secondary | ICD-10-CM | POA: Diagnosis present

## 2019-09-01 DIAGNOSIS — Z8249 Family history of ischemic heart disease and other diseases of the circulatory system: Secondary | ICD-10-CM | POA: Diagnosis not present

## 2019-09-01 DIAGNOSIS — Z833 Family history of diabetes mellitus: Secondary | ICD-10-CM | POA: Insufficient documentation

## 2019-09-01 DIAGNOSIS — Z853 Personal history of malignant neoplasm of breast: Secondary | ICD-10-CM | POA: Insufficient documentation

## 2019-09-01 DIAGNOSIS — Z803 Family history of malignant neoplasm of breast: Secondary | ICD-10-CM | POA: Diagnosis not present

## 2019-09-01 DIAGNOSIS — Z8379 Family history of other diseases of the digestive system: Secondary | ICD-10-CM | POA: Diagnosis not present

## 2019-09-01 DIAGNOSIS — Z801 Family history of malignant neoplasm of trachea, bronchus and lung: Secondary | ICD-10-CM | POA: Diagnosis not present

## 2019-09-01 DIAGNOSIS — F17210 Nicotine dependence, cigarettes, uncomplicated: Secondary | ICD-10-CM | POA: Diagnosis not present

## 2019-09-01 DIAGNOSIS — K219 Gastro-esophageal reflux disease without esophagitis: Secondary | ICD-10-CM | POA: Insufficient documentation

## 2019-09-01 DIAGNOSIS — Z20822 Contact with and (suspected) exposure to covid-19: Secondary | ICD-10-CM | POA: Insufficient documentation

## 2019-09-01 DIAGNOSIS — F419 Anxiety disorder, unspecified: Secondary | ICD-10-CM | POA: Diagnosis not present

## 2019-09-01 DIAGNOSIS — K529 Noninfective gastroenteritis and colitis, unspecified: Secondary | ICD-10-CM | POA: Diagnosis not present

## 2019-09-01 LAB — SARS CORONAVIRUS 2 (TAT 6-24 HRS): SARS Coronavirus 2: NEGATIVE

## 2019-09-01 MED ORDER — ONDANSETRON 8 MG PO TBDP: 8.0000 mg | ORAL_TABLET | Freq: Three times a day (TID) | ORAL | 0 refills | Status: DC | PRN

## 2019-09-01 MED ORDER — LOPERAMIDE HCL 2 MG PO CAPS: 2.0000 mg | ORAL_CAPSULE | Freq: Four times a day (QID) | ORAL | 0 refills | Status: DC | PRN

## 2019-09-01 NOTE — ED Triage Notes (Signed)
Pt c/o N/V/Dx2 days. Pt vomited 2 times in last 24hrs. Pt had diarrhea 8-9 times in last 24hrs.

## 2019-09-01 NOTE — ED Provider Notes (Signed)
[place]    CSN: [id number] Arrival date & time: 09/01/19  1003      History   Chief Complaint Chief Complaint  Patient presents with  . vomiting, diarrhea, weakness    HPI Kristin Rose is a 59 y.o. female.   Initial visit for this 59 yo woman  She has had nausea, vomiting, and diarrhea over the past two days.  On Friday after working her shift at the [employer] office, patient experienced an abrupt onset of nausea and vomiting.  This continued until yesterday morning, the last time she vomited.  She then developed diarrhea which has been profuse.  She is able to keep liquids down now.  She has intermittent cramping sensation with borborygmi.  No blood in stool and no fever.  No chills and no sweats.  No cough and no loss of sense of smell.  Patient works at the [employer].  She has an adult son who lives with her and had similar symptoms prior to her experiencing this.       Past Medical History:  Diagnosis Date  . Cancer of left breast [facility]) 01/2004   Treatment with lumpectomy, radiation and in 2008 finished with Tamoxifen.  . Depression   . GERD (gastroesophageal reflux disease)   . Heart murmur    as a child  . Hypertension   . Migraine    "weekly" (11/06/2014)  . Personal history of radiation therapy   . Prediabetes 2019    Patient Active Problem List   Diagnosis Date Noted  . Vitreous floaters of both eyes 03/04/2019  . Dental decay 03/04/2019  . Hypercholesterolemia 03/04/2019  . Tinea pedis of both feet 03/04/2019  . Onychomycosis 03/04/2019  . Thyromegaly 03/04/2019  . Prediabetes 05/21/2018  . Anxiety and depression 05/21/2018  . Heart murmur   . S/P ORIF (open reduction internal fixation) fracture 11/05/2014  . Chest pain of uncertain etiology [id number]  . Essential hypertension 01/16/2014  . Tobacco abuse 01/16/2014  . Cancer of left breast ([place]) 01/2004    Past Surgical History:  Procedure Laterality Date  . BREAST BIOPSY Left  01/2004  . BREAST LUMPECTOMY Left 01/2004  . COLONOSCOPY    . DILATION AND CURETTAGE OF UTERUS  1990's  . FRACTURE SURGERY  2016   Left ankle ORIf  . ORIF ANKLE FRACTURE Left 11/05/2014   Procedure: OPEN REDUCTION INTERNAL FIXATION (ORIF) LEFT BIMALLEOLAR ANKLE FRACTURE, SYNDESMOSIS;  Surgeon: Leandrew Koyanagi, MD;  Location: [place];  Service: Orthopedics;  Laterality: Left;  . TUBAL LIGATION  1990's    OB History   No obstetric history on file.      Home Medications    Prior to Admission medications   Medication Sig Start Date End Date Taking? Authorizing Provider  amLODipine (NORVASC) 10 MG tablet Take 1 tablet (10 mg total) by mouth every morning. 12/12/18   Mack Hook, MD  aspirin-acetaminophen-caffeine (EXCEDRIN MIGRAINE) [phone] MG per tablet Take 2 tablets by mouth every 8 (eight) hours as needed for headache.     [provider]  Aspirin-Salicylamide-Caffeine (BC HEADACHE POWDER PO) Take 1 Package by mouth daily as needed (for pain).    [provider]  escitalopram (LEXAPRO) 10 MG tablet Take 10 mg by mouth daily.    [provider]  loperamide (IMODIUM) 2 MG capsule Take 1 capsule (2 mg total) by mouth 4 (four) times daily as needed for diarrhea or loose stools. 09/01/19   Robyn Haber, MD  losartan-hydrochlorothiazide (  HYZAAR) 100-25 MG tablet Take 1 tablet by mouth every morning. 12/12/18   Mack Hook, MD  metoprolol tartrate (LOPRESSOR) 100 MG tablet Take 1 tablet (100 mg total) by mouth 2 (two) times daily. 12/12/18   Mack Hook, MD  mometasone (NASONEX) 50 MCG/ACT nasal spray 2 SPRAYS IN EACH NOSTRIL ONCE DAILY 07/09/19   Mack Hook, MD  Olopatadine HCl (PAZEO) 0.7 % SOLN Apply 1 drop to eye daily. 1 drop in each eye daily 08/02/18   Mack Hook, MD  omeprazole (PRILOSEC) 20 MG capsule TAKE (1) CAPSULE BY MOUTH ONCE A DAY 07/23/19   Mack Hook, MD  ondansetron (ZOFRAN-ODT) 8 MG disintegrating tablet Take  1 tablet (8 mg total) by mouth every 8 (eight) hours as needed for nausea. 09/01/19   Robyn Haber, MD  SUMAtriptan (IMITREX) 50 MG tablet 1 tab by mouth for migraine pain, may repeat in 2 hours if not relieved, max 200 mg in 24 hours. 03/04/19   Mack Hook, MD    Family History Family History  Problem Relation Age of Onset  . Pulmonary embolism Mother   . Diabetes type II Mother   . Hypertension Mother   . Hypertension Father   . Glaucoma Father   . Lung cancer Father        2005  . Cancer Sister 62       Breast  . Diabetes Brother   . Schizophrenia Brother   . Diabetes Daughter        Obese  . Crohn's disease Brother   . Diabetes Brother     Social History Social History   Tobacco Use  . Smoking status: Current Every Day Smoker    Packs/day: 0.50    Years: 30.00    Pack years: 15.00    Types: Cigarettes  . Smokeless tobacco: Never Used  . Tobacco comment: Has quit with Chantix before--quit for up to 2 years and then goes back to smoking during stress.  Substance Use Topics  . Alcohol use: Not Currently    Comment: "used to drink; stopped ~ 2010"  . Drug use: No     Allergies   Lisinopril and Percocet [oxycodone-acetaminophen]   Review of Systems Review of Systems  Constitutional: Negative.   Gastrointestinal: Positive for abdominal pain, diarrhea and nausea.  Neurological: Positive for weakness.  All other systems reviewed and are negative.    Physical Exam Triage Vital Signs ED Triage Vitals [09/01/19 1014]  Enc Vitals Group     BP (!) 151/98     Pulse Rate 97     Resp 16     Temp 98.7 F (37.1 C)     Temp Source Oral     SpO2 98 %     Weight      Height      Head Circumference      Peak Flow      Pain Score      Pain Loc      Pain Edu?      Excl. in [place]?    No data found.  Updated Vital Signs BP (!) 151/98   Pulse 97   Temp 98.7 F (37.1 C) (Oral)   Resp 16   Ht 5\' 4"  (1.626 m)   Wt 77.1 kg   LMP  (LMP Unknown)    SpO2 98%   BMI 29.18 kg/m    Physical Exam Vitals and nursing note reviewed.  Constitutional:      General: She is not in  acute distress.    Appearance: Normal appearance. She is obese. She is not ill-appearing, toxic-appearing or diaphoretic.  HENT:     Head: Normocephalic.  Eyes:     Conjunctiva/sclera: Conjunctivae normal.  Cardiovascular:     Rate and Rhythm: Normal rate and regular rhythm.  Pulmonary:     Effort: Pulmonary effort is normal.     Breath sounds: Normal breath sounds.  Abdominal:     Tenderness: There is no guarding.     Comments: Hyperactive bowel sounds with no tenderness or rebound, no masses.  Musculoskeletal:        General: Normal range of motion.     Cervical back: Normal range of motion.  Skin:    General: Skin is warm and dry.  Neurological:     General: No focal deficit present.     Mental Status: She is alert and oriented to person, place, and time.  Psychiatric:        Mood and Affect: Mood normal.        Behavior: Behavior normal.        Thought Content: Thought content normal.        Judgment: Judgment normal.      UC Treatments / Results  Labs (all labs ordered are listed, but only abnormal results are displayed) Labs Reviewed  SARS CORONAVIRUS 2 (TAT 6-24 HRS)     Initial Impression / Assessment and Plan / UC Course  I have reviewed the triage vital signs and the nursing notes.  Pertinent labs & imaging results that were available during my care of the patient were reviewed by me and considered in my medical decision making (see chart for details).    Final Clinical Impressions(s) / UC Diagnoses   Final diagnoses:  Noninfectious gastroenteritis, unspecified type     Discharge Instructions     This most likely is a gastroenteritis caused by Staphylococcus.  The abrupt onset and the decision of vomiting with the diarrhea makes this the most likely culprit.  You may have contracted this from your son.  It is often caused by  spoiled food.  You should see improvement in the next 24 hours.  If not, please return for further evaluation.    ED Prescriptions    Medication Sig Dispense Auth. Provider   ondansetron (ZOFRAN-ODT) 8 MG disintegrating tablet Take 1 tablet (8 mg total) by mouth every 8 (eight) hours as needed for nausea. 12 tablet Robyn Haber, MD   loperamide (IMODIUM) 2 MG capsule Take 1 capsule (2 mg total) by mouth 4 (four) times daily as needed for diarrhea or loose stools. 12 capsule Robyn Haber, MD     I have reviewed the PDMP during this encounter.   Robyn Haber, MD 09/01/19 1031

## 2019-09-01 NOTE — Discharge Instructions (Addendum)
This most likely is a gastroenteritis caused by Staphylococcus.  The abrupt onset and the decision of vomiting with the diarrhea makes this the most likely culprit.  You may have contracted this from your son.  It is often caused by spoiled food.  You should see improvement in the next 24 hours.  If not, please return for further evaluation.

## 2019-09-02 ENCOUNTER — Ambulatory Visit: Payer: Medicaid Other | Admitting: Internal Medicine

## 2019-09-02 ENCOUNTER — Other Ambulatory Visit: Payer: Self-pay | Admitting: Internal Medicine

## 2019-10-02 ENCOUNTER — Ambulatory Visit: Payer: Medicaid Other | Admitting: Internal Medicine

## 2019-10-15 ENCOUNTER — Ambulatory Visit (INDEPENDENT_AMBULATORY_CARE_PROVIDER_SITE_OTHER): Payer: No Payment, Other | Admitting: Clinical

## 2019-10-15 ENCOUNTER — Other Ambulatory Visit: Payer: Self-pay

## 2019-10-15 DIAGNOSIS — F330 Major depressive disorder, recurrent, mild: Secondary | ICD-10-CM | POA: Insufficient documentation

## 2019-10-15 DIAGNOSIS — F411 Generalized anxiety disorder: Secondary | ICD-10-CM

## 2019-10-15 NOTE — Progress Notes (Signed)
Comprehensive Clinical Assessment (CCA) Note  10/16/2019 Kristin Rose [id number]  Visit Diagnosis:      ICD-10-CM   1. Major depressive disorder, recurrent, mild (HCC)  F33.0   2. Generalized anxiety disorder  F41.1       CCA Screening, Triage and Referral (STR)  Patient Reported Information How did you hear about [place]? Other (Comment)  Referral name: [facility]  Referral phone number: No data recorded  Whom do you see for routine medical problems? I don't have a doctor  Practice/Facility Name: No data recorded Practice/Facility Phone Number: No data recorded Name of Contact: No data recorded Contact Number: No data recorded Contact Fax Number: No data recorded Prescriber Name: No data recorded Prescriber Address (if known): No data recorded  What Is the Reason for Your Visit/Call Today? Assessment  How Long Has This Been Causing You Problems? > than 6 months  What Do You Feel Would Help You the Most Today? Therapy;Medication;Assessment Only   Have You Recently Been in Any Inpatient Treatment (Hospital/Detox/Crisis Center/28-Day Program)? No  Name/Location of Program/Hospital:No data recorded How Long Were You There? No data recorded When Were You Discharged? No data recorded  Have You Ever Received Services From [facility] Before? No  Who Do You See at [facility]? No data recorded  Have You Recently Had Any Thoughts About Hurting Yourself? No  Are You Planning to Commit Suicide/Harm Yourself At This time? No   Have you Recently Had Thoughts About [place]? No  Explanation: No data recorded  Have You Used Any Alcohol or Drugs in the Past 24 Hours? No  How Long Ago Did You Use Drugs or Alcohol? No data recorded What Did You Use and How Much? No data recorded  Do You Currently Have a Therapist/Psychiatrist? No  Name of Therapist/Psychiatrist: No data recorded  Have You Been Recently Discharged From Any Office Practice or Programs?  No  Explanation of Discharge From Practice/Program: No data recorded    CCA Screening Triage Referral Assessment Type of Contact: Phone Call  Is this Initial or Reassessment? Initial Assessment  Date Telepsych consult ordered in [facility]:  No data recorded Time Telepsych consult ordered in [facility]:  No data recorded  Patient Reported Information Reviewed? Yes  Patient Left Without Being Seen? No data recorded Reason for Not Completing Assessment: No data recorded  Collateral Involvement: No data recorded  Does Patient Have a [place]? No data recorded Name and Contact of Legal Guardian: No data recorded If Minor and Not Living with Parent(s), Who has Custody? No data recorded Is CPS involved or ever been involved? Never  Is APS involved or ever been involved? Never   Patient Determined To Be At Risk for Harm To Self or Others Based on Review of Patient Reported Information or Presenting Complaint? No  Method: No data recorded Availability of Means: No data recorded Intent: No data recorded Notification Required: No data recorded Additional Information for Danger to Others Potential: No data recorded Additional Comments for Danger to Others Potential: No data recorded Are There Guns or Other Weapons in Your Home? No data recorded Types of Guns/Weapons: No data recorded Are These Weapons Safely Secured?                            No data recorded Who Could Verify You Are Able To Have These Secured: No data recorded Do You Have any Outstanding Charges, Pending Court Dates, Parole/Probation?  No data recorded Contacted To Inform of Risk of Harm To Self or Others: No data recorded  Location of Assessment: [facility] Assessment Services   Does Patient Present under Involuntary Commitment? No  IVC Papers Initial File Date: No data recorded  [place] of Residence: [place]   Patient Currently Receiving the Following Services: Individual Therapy;Medication  Management   Determination of Need: Routine (7 days)   Options For Referral: Medication Management;Outpatient Therapy     CCA Biopsychosocial  Intake/Chief Complaint:  CCA Intake With Chief Complaint CCA Part Two Date: 10/15/19 CCA Part Two Time: 65 Chief Complaint/Presenting Problem: Client reported history of depression and anxiety Patient's Currently Reported Symptoms/Problems: Problems related to depression anad anxiety Individual's Strengths: Client stated, "I'm a strong person". Individual's Preferences: Client stated, "to grow and be positive". Type of Services Patient Feels Are Needed: Therapy and medication management  Mental Health Symptoms Depression:  Depression: Change in energy/activity, Hopelessness, Increase/decrease in appetite, Sleep (too much or little), Duration of symptoms greater than two weeks  Mania:  Mania: N/A  Anxiety:   Anxiety: Difficulty concentrating, Restlessness, Tension, Worrying, Fatigue  Psychosis:  Psychosis: None  Trauma:  Trauma: N/A  Obsessions:  Obsessions: N/A  Compulsions:  Compulsions: None  Inattention:  Inattention: None  Hyperactivity/Impulsivity:  Hyperactivity/Impulsivity: N/A  Oppositional/Defiant Behaviors:  Oppositional/Defiant Behaviors: N/A  Emotional Irregularity:  Emotional Irregularity: Chronic feelings of emptiness  Other Mood/Personality Symptoms:      Mental Status Exam Appearance and self-care  Stature:  Stature: Average  Weight:  Weight: Average weight  Clothing:  Clothing: Casual  Grooming:  Grooming: Normal  Cosmetic use:  Cosmetic Use: Age appropriate  Posture/gait:  Posture/Gait: Normal  Motor activity:  Motor Activity: Not Remarkable  Sensorium  Attention:  Attention: Normal  Concentration:  Concentration: Normal  Orientation:  Orientation: X5  Recall/memory:  Recall/Memory: Normal  Affect and Mood  Affect:  Affect: Congruent  Mood:  Mood: Other (Comment)  Relating  Eye contact:  Eye Contact:  None  Facial expression:  Facial Expression: Responsive  Attitude toward examiner:  Attitude Toward Examiner: Cooperative  Thought and Language  Speech flow: Speech Flow: Clear and Coherent  Thought content:  Thought Content: Appropriate to Mood and Circumstances  Preoccupation:  Preoccupations: None  Hallucinations:  Hallucinations: None  Organization:     Transport planner of Knowledge:  Fund of Knowledge: Good  Intelligence:  Intelligence: Average  Abstraction:  Abstraction: Psychologist, sport and exercise:  Judgement: Good  Reality Testing:  Reality Testing: Realistic  Insight:  Insight: Good  Decision Making:  Decision Making: Normal  Social Functioning  Social Maturity:  Social Maturity: Responsible  Social Judgement:  Social Judgement: Normal  Stress  Stressors:  Stressors: Family conflict, Transitions  Coping Ability:     Skill Deficits:     Supports:  Supports: Church, Family     Religion: Religion/Spirituality Are You A Religious Person?: Yes  Leisure/Recreation: Leisure / Recreation Do You Have Hobbies?: No  Exercise/Diet: Exercise/Diet Do You Exercise?: No Have You Gained or Lost A Significant Amount of Weight in the Past Six Months?: No Do You Follow a Special Diet?: No Do You Have Any Trouble Sleeping?: Yes   CCA Employment/Education  Employment/Work Situation: Employment / Work Situation Where was the patient employed at that time?: Client reported she is currently working for the [employer] since 2018. Has patient ever been in the [employer]?: No  Education: Education Last Grade Completed: 12 Did Teacher, adult education From [employer]?: Yes Did You  Attend College?: Yes What Type of College Degree Do you Have?: Client reported starting Cosmetology school but did not finish because she was pregnant at the time.   CCA Family/Childhood History  Family and Relationship History: Family history Marital status: Married What types of issues is patient  dealing with in the relationship?: Client reported her husband adopted his grnadson whom they consider a son. Client reported he has adult adhd and it has been hard getting him what he needs and also dealing with husbands health problems.  Childhood History:  Childhood History By whom was/is the patient raised?: Both parents Additional childhood history information: Client reported having a good upbringing. Does patient have siblings?: Yes Number of Siblings: 5 Description of patient's current relationship with siblings: Client reported she is close with her siblings. Did patient suffer any verbal/emotional/physical/sexual abuse as a child?: No Did patient suffer from severe childhood neglect?: No Has patient ever been sexually abused/assaulted/raped as an adolescent or adult?: No Was the patient ever a victim of a crime or a disaster?: No Witnessed domestic violence?: No Has patient been affected by domestic violence as an adult?: No  Child/Adolescent Assessment:     CCA Substance Use  Alcohol/Drug Use: Alcohol / Drug Use History of alcohol / drug use?: No history of alcohol / drug abuse                         ASAM's:  Six Dimensions of Multidimensional Assessment  Dimension 1:  Acute Intoxication and/or Withdrawal Potential:      Dimension 2:  Biomedical Conditions and Complications:      Dimension 3:  Emotional, Behavioral, or Cognitive Conditions and Complications:     Dimension 4:  Readiness to Change:     Dimension 5:  Relapse, Continued use, or Continued Problem Potential:     Dimension 6:  Recovery/Living Environment:     ASAM Severity Score:    ASAM Recommended Level of Treatment:     Substance use Disorder (SUD)    Recommendations for Services/Supports/Treatments:    DSM5 Diagnoses: Patient Active Problem List   Diagnosis Date Noted  . Major depressive disorder, recurrent, mild ([place]) 10/15/2019  . Generalized anxiety disorder 10/15/2019  .  Vitreous floaters of both eyes 03/04/2019  . Dental decay 03/04/2019  . Hypercholesterolemia 03/04/2019  . Tinea pedis of both feet 03/04/2019  . Onychomycosis 03/04/2019  . Thyromegaly 03/04/2019  . Prediabetes 05/21/2018  . Anxiety and depression 05/21/2018  . Heart murmur   . S/P ORIF (open reduction internal fixation) fracture 11/05/2014  . Chest pain of uncertain etiology 76/19/5093  . Essential hypertension 01/16/2014  . Tobacco abuse 01/16/2014  . Cancer of left breast [facility]) 01/2004    Patient Centered Plan: Patient is on the following Treatment Plan(s):  Anxiety and Depression   Interpretive Summary:  Client is a 59 year old female. Client is referred by [facility] for behavioral health services.   Client denies suicidal and homicidal ideations at this time. Client denies hallucinations and delusions at this time.  Client reports no substance use issues.   Client was screened for the following SDOH:    Counselor from 10/15/2019 in [facility]  PHQ-9 Total Score  4     GAD 7 : Generalized Anxiety Score 10/15/2019  Nervous, Anxious, on Edge 1  Control/stop worrying 1  Worry too much - different things 1  Trouble relaxing 0  Restless 0  Easily annoyed or irritable 1  Afraid - awful might happen 1  Total GAD 7 Score 5  Anxiety Difficulty Somewhat difficult      Client reported a treatment history of depression and anxiety. Client describes her depression as "very sad, down in the dumps, not eating enough, no energy, and not wanting to be around people". Client describes her anxiety as her mind racing, negative thoughts, and feeling tense. Client reported stressors have included family conflict. Client reported her son whom is her husband's grandson that they adopted has adult ADHD and also taking care of her husband's medical issues have taken a toll on her over time. Client reported she does not fully disclose how she feels to her supports  because she does not want to feel like a burden.    Treatment recommendations are individual therapy and medication management.     Clinician provided information on format of appointment (virtual or face to face).      Client was in agreement with treatment recommendations.   Virtual Visit via Telephone Note  I connected with Carlyle Basques on 10/16/19 at 11:00 AM EDT by telephone and verified that I am speaking with the correct person using two identifiers.  Location: Patient: home Provider: office   I discussed the limitations, risks, security and privacy concerns of performing an evaluation and management service by telephone and the availability of in person appointments. I also discussed with the patient that there may be a patient responsible charge related to this service. The patient expressed understanding and agreed to proceed.   Follow Up Instructions:    I discussed the assessment and treatment plan with the patient. The patient was provided an opportunity to ask questions and all were answered. The patient agreed with the plan and demonstrated an understanding of the instructions.   The patient was advised to call back or seek an in-person evaluation if the symptoms worsen or if the condition fails to improve as anticipated.  I provided 53 minutes of non-face-to-face time during this encounter.   Birdena Jubilee Mouhamad Teed, LCSW       Referrals to Alternative Service(s): Referred to Alternative Service(s):   Place:   Date:   Time:    Referred to Alternative Service(s):   Place:   Date:   Time:    Referred to Alternative Service(s):   Place:   Date:   Time:    Referred to Alternative Service(s):   Place:   Date:   Time:     Bernestine Amass

## 2019-10-29 ENCOUNTER — Other Ambulatory Visit: Payer: Self-pay

## 2019-10-29 ENCOUNTER — Ambulatory Visit (INDEPENDENT_AMBULATORY_CARE_PROVIDER_SITE_OTHER): Payer: No Payment, Other | Admitting: Clinical

## 2019-10-29 DIAGNOSIS — F330 Major depressive disorder, recurrent, mild: Secondary | ICD-10-CM

## 2019-10-29 NOTE — Progress Notes (Signed)
   THERAPIST PROGRESS NOTE  Session Time: 53 minutes  Participation Level: Active  Behavioral Response: CasualAlertDepressed  Type of Therapy: Individual Therapy  Treatment Goals addressed: Coping  Interventions: CBT  Summary:  Kristin Rose is a 59 y.o. female who presents for scheduled appointment appropriately dressed, oriented times five, and friendly.  Client reported presently she is feeling okay, sleeping, eating and taking her medication. Client collaborated with the therapist to discuss her motivation for therapy and current stressors that are impacting her mental health. Client reported her home environment and nature of her marriage has negatively changed over the years. Client reported she has support from her daughter but she does not fully disclose her thoughts because she does not want to burden her. Client reported the current state of her marriage has caused her to feel "embarrased" and she does not know why. Client reported her idea of happiness includes "peace, no added stress, and selfishly spending time on me". Client described herself as "independent, reliable, caring, compassionate, and the fixer".  Client reported ability to positively redirect herself. Client reported she uses her self talk to remind herself "this is a moment in time" and focuses on the good and positive changes to come.   Suicidal/Homicidal: Nowithout intent/plan  Therapist Response:  Therapist assessed the clients current mood. Therapist continued to work toward building a therapeutic relationship with the client by using eye contact, active listening, empathy, and supportive feedback.  Therapist used CBT to prompt and engage the client to discuss how her current stressors have affected her thoughts and behavior. Therapist assigned the client homework to think about how therapy can help to improve her depressive symptoms as she works through making life decisions in her personal life.   Plan:  Return again in 2 weeks for individual therapy. Client was scheduled for next appointment before leaving.  Diagnosis: Major depressive disorder, recurrent episode, mild    Skip Litke Y Hartlyn Reigel, LCSW 10/29/2019

## 2019-10-31 ENCOUNTER — Other Ambulatory Visit: Payer: Self-pay

## 2019-10-31 MED ORDER — OMEPRAZOLE 20 MG PO CPDR: 20.0000 mg | DELAYED_RELEASE_CAPSULE | Freq: Every day | ORAL | 2 refills | Status: DC

## 2019-11-15 ENCOUNTER — Ambulatory Visit (HOSPITAL_COMMUNITY): Payer: Self-pay | Admitting: Clinical

## 2019-11-19 ENCOUNTER — Other Ambulatory Visit: Payer: Self-pay

## 2019-11-19 ENCOUNTER — Encounter (HOSPITAL_COMMUNITY): Payer: Self-pay | Admitting: Psychiatry

## 2019-11-19 ENCOUNTER — Telehealth (INDEPENDENT_AMBULATORY_CARE_PROVIDER_SITE_OTHER): Payer: No Payment, Other | Admitting: Psychiatry

## 2019-11-19 DIAGNOSIS — F329 Major depressive disorder, single episode, unspecified: Secondary | ICD-10-CM | POA: Diagnosis not present

## 2019-11-19 DIAGNOSIS — F419 Anxiety disorder, unspecified: Secondary | ICD-10-CM | POA: Diagnosis not present

## 2019-11-19 DIAGNOSIS — F32A Depression, unspecified: Secondary | ICD-10-CM

## 2019-11-19 MED ORDER — ESCITALOPRAM OXALATE 20 MG PO TABS: 20.0000 mg | ORAL_TABLET | Freq: Every day | ORAL | 2 refills | Status: DC

## 2019-11-19 NOTE — Progress Notes (Signed)
Psychiatric Initial Adult Assessment  Virtual Visit via Telephone Note  I connected with Kristin Rose on 11/19/19 at  1:00 PM EDT by telephone and verified that I am speaking with the correct person using two identifiers.  Location: Patient: home Provider: Clinic   I discussed the limitations, risks, security and privacy concerns of performing an evaluation and management service by telephone and the availability of in person appointments. I also discussed with the patient that there may be a patient responsible charge related to this service. The patient expressed understanding and agreed to proceed.   I provided 45 minutes of non-face-to-face time during this encounter.   Patient Identification: Kristin Rose MRN:  [id number] Date of Evaluation:  11/19/2019 Referral Source: [facility] Chief Complaint:  "At times I still have depression but things are better than they once were"  Visit Diagnosis:    ICD-10-CM   1. Anxiety and depression  F41.9 escitalopram (LEXAPRO) 20 MG tablet   F32.9     History of Present Illness: 59 year old female seen today for initial psychiatric evaluation.  She was referred to outpatient psychiatry by [facility] for medication management.  She has a psychiatric history of anxiety and depression.  She is currently being managed on Lexapro 10 mg.  Today patient reports that symptoms of depression are improving however notes that she occasionally has intermittent symptoms of depression and anxiety.  Patient reports that she has increased life stressors including having an adopted son who has ADHD and having a husband with varying medical conditions.  She notes that recently she has been taking herself out of the situation and allowing her husband to handle their son and his behavior.  She reports that she feels that her husband let their son get away with too many things.  She notes to cope with stress by realizing she unable to change other behaviors and notes that she  is trying to change her behaviors.  Overall she reports that she is doing well however notes that she would like her Lexapro adjusted to help manage intermittent symptoms of depression.  She is agreeable to increasing Lexapro 10 mg to 20 mg daily.  She will also follow-up with outpatient counselor for therapy.  No other concerns noted at this time.  Associated Signs/Symptoms: Depression Symptoms:  depressed mood, (Hypo) Manic Symptoms:  Denies Anxiety Symptoms:  Denies Psychotic Symptoms:  Denies PTSD Symptoms: NA  Past Psychiatric History: Anxiety and depression  Previous Psychotropic Medications: Yes   Substance Abuse History in the last 12 months:  No.  Consequences of Substance Abuse: NA  Past Medical History:  Past Medical History:  Diagnosis Date  . Cancer of left breast [facility]) 01/2004   Treatment with lumpectomy, radiation and in 2008 finished with Tamoxifen.  . Depression   . GERD (gastroesophageal reflux disease)   . Heart murmur    as a child  . Hypertension   . Migraine    "weekly" (11/06/2014)  . Personal history of radiation therapy   . Prediabetes 2019    Past Surgical History:  Procedure Laterality Date  . BREAST BIOPSY Left 01/2004  . BREAST LUMPECTOMY Left 01/2004  . COLONOSCOPY    . DILATION AND CURETTAGE OF UTERUS  1990's  . FRACTURE SURGERY  2016   Left ankle ORIf  . ORIF ANKLE FRACTURE Left 11/05/2014   Procedure: OPEN REDUCTION INTERNAL FIXATION (ORIF) LEFT BIMALLEOLAR ANKLE FRACTURE, SYNDESMOSIS;  Surgeon: Leandrew Koyanagi, MD;  Location: [facility];  Service: Orthopedics;  Laterality: Left;  . TUBAL LIGATION  98's    Family Psychiatric History: Brother paranoid schizophrenia  Family History:  Family History  Problem Relation Age of Onset  . Pulmonary embolism Mother   . Diabetes type II Mother   . Hypertension Mother   . Hypertension Father   . Glaucoma Father   . Lung cancer Father        2005  . Cancer Sister 54       Breast  . Diabetes  Brother   . Schizophrenia Brother   . Diabetes Daughter        Obese  . Crohn's disease Brother   . Diabetes Brother     Social History:   Social History   Socioeconomic History  . Marital status: Married    Spouse name: Sandar Krinke  . Number of children: 2  . Years of education: 17  . Highest education level: High school graduate  Occupational History  . Occupation: Marine scientist work  Tobacco Use  . Smoking status: Current Every Day Smoker    Packs/day: 0.50    Years: 30.00    Pack years: 15.00    Types: Cigarettes  . Smokeless tobacco: Never Used  . Tobacco comment: Has quit with Chantix before--quit for up to 2 years and then goes back to smoking during stress.  Vaping Use  . Vaping Use: Former  Substance and Sexual Activity  . Alcohol use: Not Currently    Comment: "used to drink; stopped ~ 2010"  . Drug use: No  . Sexual activity: Not Currently    Birth control/protection: Surgical  Other Topics Concern  . Not on file  Social History Narrative   Lives with husband.   Husband disabled   She is doing well now that she is working (2020)   Social Determinants of Radio broadcast assistant Strain:   . Difficulty of Paying Living Expenses:   Food Insecurity:   . Worried About Charity fundraiser in the Last Year:   . Arboriculturist in the Last Year:   Transportation Needs:   . Film/video editor (Medical):   Marland Kitchen Lack of Transportation (Non-Medical):   Physical Activity:   . Days of Exercise per Week:   . Minutes of Exercise per Session:   Stress:   . Feeling of Stress :   Social Connections:   . Frequency of Communication with Friends and Family:   . Frequency of Social Gatherings with Friends and Family:   . Attends Religious Services:   . Active Member of Clubs or Organizations:   . Attends Archivist Meetings:   Marland Kitchen Marital Status:     Additional Social History: Patient resides in [place] with her husband and 19 year old son.  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  She denies alcohol, tobacco, or illicit drug use.  Allergies:   Allergies  Allergen Reactions  . Lisinopril Cough  . Percocet [Oxycodone-Acetaminophen] Itching    Metabolic Disorder Labs: Lab Results  Component Value Date   HGBA1C 5.0 05/21/2018   No results found for: PROLACTIN Lab Results  Component Value Date   CHOL 176 03/04/2019   TRIG 94 03/04/2019   HDL 54 03/04/2019   LDLCALC 105 (H) 03/04/2019   LDLCALC 125 (H) 05/21/2018   Lab Results  Component Value Date   TSH 1.250 03/04/2019    Therapeutic Level Labs: No results found for: LITHIUM No results found for: CBMZ No  results found for: VALPROATE  Current Medications: Current Outpatient Medications  Medication Sig Dispense Refill  . amLODipine (NORVASC) 10 MG tablet Take 1 tablet (10 mg total) by mouth every morning. 30 tablet 10  . aspirin-acetaminophen-caffeine (EXCEDRIN MIGRAINE) 250-250-65 MG per tablet Take 2 tablets by mouth every 8 (eight) hours as needed for headache.     . Aspirin-Salicylamide-Caffeine (BC HEADACHE POWDER PO) Take 1 Package by mouth daily as needed (for pain).    Marland Kitchen escitalopram (LEXAPRO) 20 MG tablet Take 1 tablet (20 mg total) by mouth daily. 30 tablet 2  . loperamide (IMODIUM) 2 MG capsule Take 1 capsule (2 mg total) by mouth 4 (four) times daily as needed for diarrhea or loose stools. 12 capsule 0  . losartan-hydrochlorothiazide (HYZAAR) 100-25 MG tablet Take 1 tablet by mouth every morning. 30 tablet 10  . metoprolol tartrate (LOPRESSOR) 100 MG tablet Take 1 tablet (100 mg total) by mouth 2 (two) times daily. 60 tablet 10  . mometasone (NASONEX) 50 MCG/ACT nasal spray 2 SPRAYS IN EACH NOSTRIL ONCE DAILY 17 g 0  . Olopatadine HCl (PAZEO) 0.7 % SOLN Apply 1 drop to eye daily. 1 drop in each eye daily 1 Bottle 11  . omeprazole (PRILOSEC) 20 MG capsule Take 1 capsule (20 mg total) by mouth daily. 30 capsule 2  . ondansetron  (ZOFRAN-ODT) 8 MG disintegrating tablet Take 1 tablet (8 mg total) by mouth every 8 (eight) hours as needed for nausea. 12 tablet 0  . SUMAtriptan (IMITREX) 50 MG tablet 1 tab by mouth for migraine pain, may repeat in 2 hours if not relieved, max 200 mg in 24 hours. 9 tablet 6   No current facility-administered medications for this visit.    Musculoskeletal: Strength & Muscle Tone: Unable to assess due to telehealth visit [place]: Unable to assess due to telehealth visit Patient leans: N/A  Psychiatric Specialty Exam: Review of Systems  There were no vitals taken for this visit.There is no height or weight on file to calculate BMI.  General Appearance: Unable to assess due to telephone visit  Eye Contact:  Unable to assess due to telephone visit  Speech:  Clear and Coherent and Normal Rate  Volume:  Normal  Mood:  Euthymic  Affect:  Congruent  Thought Process:  Coherent, Goal Directed and Linear  Orientation:  Full (Time, Place, and Person)  Thought Content:  WDL and Logical  Suicidal Thoughts:  No  Homicidal Thoughts:  No  Memory:  Immediate;   Good Recent;   Good Remote;   Good  Judgement:  Good  Insight:  Good  Psychomotor Activity:  Normal  Concentration:  Concentration: Good and Attention Span: Good  Recall:  Good  Fund of Knowledge:Good  Language: Good  Akathisia:  No  Handed:  Right  AIMS (if indicated):  Not done  Assets:  Communication Skills Desire for Improvement Financial Resources/Insurance Housing Social Support  ADL's:  Intact  Cognition: WNL  Sleep:  Good   Screenings: GAD-7     Counselor from 10/15/2019 in [facility]  Total GAD-7 Score 5    PHQ2-9     Counselor from 10/15/2019 in [facility]  PHQ-2 Total Score 2  PHQ-9 Total Score 4      Assessment and Plan: Patient reports that overall she is doing well.  At times she notes that she has intermittent depression and anxiety.  She  is agreeable to increasing Lexapro 10 mg to  20 mg to help improve symptoms of depression and anxiety.   1. Anxiety and depression  Increased- escitalopram (LEXAPRO) 20 MG tablet; Take 1 tablet (20 mg total) by mouth daily.  Dispense: 30 tablet; Refill: 2  Follow-up in 3 months Follow-up with therapy   Salley Slaughter, NP 7/13/20211:14 PM

## 2019-12-09 ENCOUNTER — Ambulatory Visit (HOSPITAL_COMMUNITY): Payer: Self-pay | Admitting: Clinical

## 2019-12-10 ENCOUNTER — Telehealth: Payer: Self-pay | Admitting: Internal Medicine

## 2019-12-10 ENCOUNTER — Other Ambulatory Visit: Payer: Self-pay

## 2019-12-10 DIAGNOSIS — D0510 Intraductal carcinoma in situ of unspecified breast: Secondary | ICD-10-CM

## 2019-12-10 DIAGNOSIS — E559 Vitamin D deficiency, unspecified: Secondary | ICD-10-CM

## 2019-12-10 MED ORDER — OMEPRAZOLE 20 MG PO CPDR: 20.0000 mg | DELAYED_RELEASE_CAPSULE | Freq: Every day | ORAL | 2 refills | Status: DC

## 2019-12-10 MED ORDER — AMLODIPINE BESYLATE 10 MG PO TABS: 10.0000 mg | ORAL_TABLET | Freq: Every morning | ORAL | 2 refills | Status: DC

## 2019-12-10 MED ORDER — METOPROLOL TARTRATE 100 MG PO TABS: 100.0000 mg | ORAL_TABLET | Freq: Two times a day (BID) | ORAL | 2 refills | Status: DC

## 2019-12-10 MED ORDER — LOSARTAN POTASSIUM-HCTZ 100-25 MG PO TABS: 1.0000 | ORAL_TABLET | Freq: Every morning | ORAL | 2 refills | Status: DC

## 2019-12-10 NOTE — Telephone Encounter (Signed)
Patient called requesting all her meds  called in at [facility]. Patient stated does not have orange card anymore and is unable to fill meds at [facility].

## 2019-12-10 NOTE — Telephone Encounter (Signed)
Please notify patient. Rx's were sent to [facility]. Please also schedule patient for follow up appointment. Patient has not been seen in almost a year. Please notify patient theses are her last refills until she schedules.

## 2019-12-10 NOTE — Telephone Encounter (Signed)
Patient informed. Appointment scheduled 01/28/2020

## 2020-01-28 ENCOUNTER — Encounter: Payer: Self-pay | Admitting: Internal Medicine

## 2020-01-28 ENCOUNTER — Ambulatory Visit (INDEPENDENT_AMBULATORY_CARE_PROVIDER_SITE_OTHER): Payer: Medicaid Other | Admitting: Internal Medicine

## 2020-01-28 VITALS — BP 138/98 | HR 64 | Resp 12 | Ht 62.5 in | Wt 179.0 lb

## 2020-01-28 DIAGNOSIS — E7800 Pure hypercholesterolemia, unspecified: Secondary | ICD-10-CM

## 2020-01-28 DIAGNOSIS — Z720 Tobacco use: Secondary | ICD-10-CM

## 2020-01-28 DIAGNOSIS — E010 Iodine-deficiency related diffuse (endemic) goiter: Secondary | ICD-10-CM

## 2020-01-28 DIAGNOSIS — Z6833 Body mass index (BMI) 33.0-33.9, adult: Secondary | ICD-10-CM

## 2020-01-28 DIAGNOSIS — D0512 Intraductal carcinoma in situ of left breast: Secondary | ICD-10-CM

## 2020-01-28 DIAGNOSIS — E669 Obesity, unspecified: Secondary | ICD-10-CM

## 2020-01-28 DIAGNOSIS — Z716 Tobacco abuse counseling: Secondary | ICD-10-CM

## 2020-01-28 DIAGNOSIS — I10 Essential (primary) hypertension: Secondary | ICD-10-CM

## 2020-01-28 MED ORDER — AMLODIPINE BESYLATE 10 MG PO TABS: 10.0000 mg | ORAL_TABLET | Freq: Every morning | ORAL | 11 refills | Status: AC

## 2020-01-28 MED ORDER — LOSARTAN POTASSIUM-HCTZ 100-25 MG PO TABS: 1.0000 | ORAL_TABLET | Freq: Every morning | ORAL | 11 refills | Status: AC

## 2020-01-28 MED ORDER — METOPROLOL TARTRATE 100 MG PO TABS
100.0000 mg | ORAL_TABLET | Freq: Two times a day (BID) | ORAL | 11 refills | Status: DC
Start: 2020-01-28 — End: 2021-11-02

## 2020-01-28 MED ORDER — OMEPRAZOLE 20 MG PO CPDR
20.0000 mg | DELAYED_RELEASE_CAPSULE | Freq: Every day | ORAL | 11 refills | Status: DC
Start: 2020-01-28 — End: 2021-09-17

## 2020-01-28 NOTE — Progress Notes (Signed)
Subjective:    Patient ID: Kristin Rose, female   DOB: January 05, 1961, 59 y.o.   MRN: [id number]   HPI   1.  History of DCIS, left breast.  Has not yet scheduled her mammogram.    2.  Hypertension:  Rushing to get here.  Has not checked bp with her home monitor.  Not clear her diet is what it should be.  She is very physically active with her job daily.    Breakfast:  Coffee with cream and sugar, eggs, grits, sausage  Mid afternoon:  Vegetable, meat and bread. Drinks juices, crystal light, water.  3.  Tobacco Abuse:  Still smoking.  Not interested in quitting currently. Quit for 2 years after using Chantix.    Current Meds  Medication Sig   amLODipine (NORVASC) 10 MG tablet Take 1 tablet (10 mg total) by mouth every morning.   aspirin-acetaminophen-caffeine (EXCEDRIN MIGRAINE) 250-250-65 MG per tablet Take 2 tablets by mouth every 8 (eight) hours as needed for headache.    Aspirin-Salicylamide-Caffeine (BC HEADACHE POWDER PO) Take 1 Package by mouth daily as needed (for pain).   B Complex-C (SUPER B COMPLEX PO) Take by mouth. 1 daily   Biotin 10000 MCG TABS Take by mouth. 1 daily   Cholecalciferol (VITAMIN D3) 50 MCG (2000 UT) TABS Take by mouth. 1 daily   escitalopram (LEXAPRO) 20 MG tablet Take 1 tablet (20 mg total) by mouth daily.   losartan-hydrochlorothiazide (HYZAAR) 100-25 MG tablet Take 1 tablet by mouth every morning.   melatonin 3 MG TABS tablet Take 3 mg by mouth at bedtime.   metoprolol tartrate (LOPRESSOR) 100 MG tablet Take 1 tablet (100 mg total) by mouth 2 (two) times daily.   Misc Natural Products (NEURIVA PO) Take by mouth. 1 daily   mometasone (NASONEX) 50 MCG/ACT nasal spray 2 SPRAYS IN EACH NOSTRIL ONCE DAILY   Olopatadine HCl (PAZEO) 0.7 % SOLN Apply 1 drop to eye daily. 1 drop in each eye daily   omeprazole (PRILOSEC) 20 MG capsule Take 1 capsule (20 mg total) by mouth daily.   OVER THE COUNTER MEDICATION Nature's Bounty sleep Aide. 1 daily at bedtime    SUMAtriptan (IMITREX) 50 MG tablet 1 tab by mouth for migraine pain, may repeat in 2 hours if not relieved, max 200 mg in 24 hours.   vitamin E 1000 UNIT capsule Take 1,000 Units by mouth daily.   zinc gluconate 50 MG tablet Take 50 mg by mouth daily.   Allergies  Allergen Reactions   Lisinopril Cough   Percocet [Oxycodone-Acetaminophen] Itching     Review of Systems    Objective:   BP (!) 138/98 (BP Location: Left Arm, Patient Position: Sitting, Cuff Size: Normal)   Pulse 64   Resp 12   Ht 5' 2.5" (1.588 m)   Wt 179 lb (81.2 kg)   LMP  (LMP Unknown)   BMI 32.22 kg/m   Physical Exam NAD HEENT:  PERRL, EOMI, discs sharp Neck: Supple, No adenopathy.  Thyroid generous. Chest:  CTA.   CV:  RRR without murmur or rub.  Radial and DP pulses normal and equal. Abd:  S, NT, No HSM or mass, + BS LE:  no edema   Assessment & Plan   DCIS history:  order for mammogram   2.  Hypertension:  Not at goal.  Encouraged working on eating habits and making goals with daily physical activity.  Encouraged looking into [facility] for swimming or  water exercise to protect joints of LE.    3. Thyromegaly:  TSH with  fasting labs in next week.  4.  Obesity:  as in #2.    5.  Tobacco abuse:  counseled to consider working on this again.  Discussed options for support to quit.  Not interested today.    6.  HM:  fasting labs in next week:  FLP, CBC, CMP, TSH.  CPE in 6 months. Flu shot voucher

## 2020-01-28 NOTE — Patient Instructions (Signed)
Drink a glass of water before every meal Drink 6-8 glasses of water daily Eat three meals daily Eat a protein and healthy fat with every meal (eggs,fish, chicken, [place] and limit red meats) Eat 5 servings of vegetables daily, mix the colors Eat 2 servings of fruit daily with skin, if skin is edible Use smaller plates Put food/utensils down as you chew and swallow each bite Eat at a table with friends/family at least once daily, no TV Do not eat in front of the TV  Recent studies show that people who consume all of their calories in a 12 hour period lose weight more efficiently.  For example, if you eat your first meal at 7:00 a.m., your last meal of the day should be completed by 7:00 p.m.   [facility] [place], [place] [address]  [phone] Hours of Operation Mondays to Thursdays: 8 am to 8 pm Fridays: 9 am to 8 pm Saturdays: 9 am to 1 pm Sundays: Closed

## 2020-01-31 ENCOUNTER — Other Ambulatory Visit: Payer: Medicaid Other

## 2020-02-19 ENCOUNTER — Other Ambulatory Visit: Payer: Self-pay

## 2020-02-19 ENCOUNTER — Encounter (HOSPITAL_COMMUNITY): Payer: Self-pay | Admitting: Psychiatry

## 2020-02-19 ENCOUNTER — Telehealth (INDEPENDENT_AMBULATORY_CARE_PROVIDER_SITE_OTHER): Payer: No Payment, Other | Admitting: Psychiatry

## 2020-02-19 DIAGNOSIS — F419 Anxiety disorder, unspecified: Secondary | ICD-10-CM

## 2020-02-19 DIAGNOSIS — F32A Depression, unspecified: Secondary | ICD-10-CM | POA: Diagnosis not present

## 2020-02-19 MED ORDER — MELATONIN 3 MG PO TABS: 3.0000 mg | ORAL_TABLET | Freq: Every day | ORAL | 2 refills | Status: DC

## 2020-02-19 MED ORDER — ESCITALOPRAM OXALATE 20 MG PO TABS: 20.0000 mg | ORAL_TABLET | Freq: Every day | ORAL | 2 refills | Status: DC

## 2020-02-19 NOTE — Progress Notes (Signed)
[place] MD/PA/NP OP Progress Note Virtual Visit via Telephone Note  I connected with Kristin Rose on 02/19/20 at  1:00 PM EDT by telephone and verified that I am speaking with the correct person using two identifiers.  Location: Patient: home Provider: Clinic   I discussed the limitations, risks, security and privacy concerns of performing an evaluation and management service by telephone and the availability of in person appointments. I also discussed with the patient that there may be a patient responsible charge related to this service. The patient expressed understanding and agreed to proceed.   I provided 30 minutes of non-face-to-face time during this encounter.    02/19/2020 10:07 AM Kristin Rose  MRN:  [id number]  Chief Complaint: "I'm just realizing my anxiety and depression has improved"  HPI: 59 year old female seen today for follow-up psychiatric evaluation.  She has a psychiatric history of anxiety and depression.  She is currently being managed on Lexapro 20 mg.  She notes that her medications are effective in managing her psychiatric conditions.    Today she is well-groomed, pleasant, cooperative, and engaged in conversation. Today, she informs provider that her depressionsymptoms are improving. She rates both her anxiety and depression as 4 out of 10, on a to 10 scale with (10 being severe anxiety and depression). She informs provider of some life stressors such as her parents aging and requiring more assistance, as well as her aunt being placed on hospice care. She informs provider she is just now realizing that her anxiety and depression have been really low. She feels like she is handling these life stressors very well.    No medication changes made today.  Patient is agreeable to continue medications as prescribed.  She will follow-up with outpatient counselor for therapy.  She will follow-up with outpatient psychiatry for therapy in 3 months.  No other concerns noted at this  time.  Visit Diagnosis:    ICD-10-CM   1. Anxiety and depression  F41.9 escitalopram (LEXAPRO) 20 MG tablet   F32.A melatonin 3 MG TABS tablet    Past Psychiatric History: Anxiety and depression  Past Medical History:  Past Medical History:  Diagnosis Date   Cancer of left breast ([place]) 01/2004   Treatment with lumpectomy, radiation and in 2008 finished with Tamoxifen.   Depression    GERD (gastroesophageal reflux disease)    Heart murmur    as a child   Hypertension    Migraine    "weekly" (11/06/2014)   Personal history of radiation therapy    Prediabetes 2019    Past Surgical History:  Procedure Laterality Date   BREAST BIOPSY Left 01/2004   BREAST LUMPECTOMY Left 01/2004   COLONOSCOPY     DILATION AND CURETTAGE OF UTERUS  1990's   FRACTURE SURGERY  2016   Left ankle ORIf   ORIF ANKLE FRACTURE Left 11/05/2014   Procedure: OPEN REDUCTION INTERNAL FIXATION (ORIF) LEFT BIMALLEOLAR ANKLE FRACTURE, SYNDESMOSIS;  Surgeon: Leandrew Koyanagi, MD;  Location: [facility];  Service: Orthopedics;  Laterality: Left;   TUBAL LIGATION  99's    Family Psychiatric History: Brother paranoid schizophrenia  Family History:  Family History  Problem Relation Age of Onset   Pulmonary embolism Mother    Diabetes type II Mother    Hypertension Mother    Hypertension Father    Glaucoma Father    Lung cancer Father        2005   Cancer Sister 36  Breast   Diabetes Brother    Schizophrenia Brother    Diabetes Daughter        Obese   Crohn's disease Brother    Diabetes Brother     Social History:  Social History   Socioeconomic History   Marital status: Married    Spouse name: Rex Magee   Number of children: 2   Years of education: 12   Highest education level: High school graduate  Occupational History   Occupation: Marine scientist work  Tobacco Use   Smoking status: Current Every Day Smoker    Packs/day: 0.50    Years: 30.00    Pack years: 15.00     Types: Cigarettes   Smokeless tobacco: Never Used   Tobacco comment: Has quit with Chantix before--quit for up to 2 years and then goes back to smoking during stress.  Vaping Use   Vaping Use: Former  Substance and Sexual Activity   Alcohol use: Not Currently    Comment: "used to drink; stopped ~ 2010"   Drug use: No   Sexual activity: Not Currently    Birth control/protection: Surgical  Other Topics Concern   Not on file  Social History Narrative   Lives with husband.   Husband disabled   She is doing well now that she is working (2020)   Social Determinants of Radio broadcast assistant Strain:    Difficulty of Paying Living Expenses: Not on file  Food Insecurity:    Worried About Charity fundraiser in the Last Year: Not on file   [employer] of Food in the Last Year: Not on file  Transportation Needs:    Film/video editor (Medical): Not on file   Lack of Transportation (Non-Medical): Not on file  Physical Activity:    Days of Exercise per Week: Not on file   Minutes of Exercise per Session: Not on file  Stress:    Feeling of Stress : Not on file  Social Connections:    Frequency of Communication with Friends and Family: Not on file   Frequency of Social Gatherings with Friends and Family: Not on file   Attends Religious Services: Not on file   Active Member of Clubs or Organizations: Not on file   Attends Archivist Meetings: Not on file   Marital Status: Not on file    Allergies:  Allergies  Allergen Reactions   Lisinopril Cough   Percocet [Oxycodone-Acetaminophen] Itching    Metabolic Disorder Labs: Lab Results  Component Value Date   HGBA1C 5.0 05/21/2018   No results found for: PROLACTIN Lab Results  Component Value Date   CHOL 176 03/04/2019   TRIG 94 03/04/2019   HDL 54 03/04/2019   LDLCALC 105 (H) 03/04/2019   LDLCALC 125 (H) 05/21/2018   Lab Results  Component Value Date   TSH 1.250 03/04/2019     Therapeutic Level Labs: No results found for: LITHIUM No results found for: VALPROATE No components found for:  CBMZ  Current Medications: Current Outpatient Medications  Medication Sig Dispense Refill   amLODipine (NORVASC) 10 MG tablet Take 1 tablet (10 mg total) by mouth every morning. 30 tablet 11   aspirin-acetaminophen-caffeine (EXCEDRIN MIGRAINE) 476-546-50 MG per tablet Take 2 tablets by mouth every 8 (eight) hours as needed for headache.      Aspirin-Salicylamide-Caffeine (BC HEADACHE POWDER PO) Take 1 Package by mouth daily as needed (for pain).     B Complex-C (SUPER B COMPLEX PO) Take  by mouth. 1 daily     Biotin 10000 MCG TABS Take by mouth. 1 daily     Cholecalciferol (VITAMIN D3) 50 MCG (2000 UT) TABS Take by mouth. 1 daily     escitalopram (LEXAPRO) 20 MG tablet Take 1 tablet (20 mg total) by mouth daily. 30 tablet 2   losartan-hydrochlorothiazide (HYZAAR) 100-25 MG tablet Take 1 tablet by mouth every morning. 30 tablet 11   melatonin 3 MG TABS tablet Take 1 tablet (3 mg total) by mouth at bedtime. 30 tablet 2   metoprolol tartrate (LOPRESSOR) 100 MG tablet Take 1 tablet (100 mg total) by mouth 2 (two) times daily. 60 tablet 11   Misc Natural Products (NEURIVA PO) Take by mouth. 1 daily     mometasone (NASONEX) 50 MCG/ACT nasal spray 2 SPRAYS IN EACH NOSTRIL ONCE DAILY 17 g 0   Olopatadine HCl (PAZEO) 0.7 % SOLN Apply 1 drop to eye daily. 1 drop in each eye daily 1 Bottle 11   omeprazole (PRILOSEC) 20 MG capsule Take 1 capsule (20 mg total) by mouth daily. 30 capsule 11   ondansetron (ZOFRAN-ODT) 8 MG disintegrating tablet Take 1 tablet (8 mg total) by mouth every 8 (eight) hours as needed for nausea. (Patient not taking: Reported on 01/28/2020) 12 tablet 0   OVER THE COUNTER MEDICATION Nature's Bounty sleep Aide. 1 daily at bedtime     SUMAtriptan (IMITREX) 50 MG tablet 1 tab by mouth for migraine pain, may repeat in 2 hours if not relieved, max 200 mg  in 24 hours. 9 tablet 6   vitamin E 1000 UNIT capsule Take 1,000 Units by mouth daily.     zinc gluconate 50 MG tablet Take 50 mg by mouth daily.     No current facility-administered medications for this visit.     Musculoskeletal: Strength & Muscle Tone: Unable to assess due to telehealth visit [place]: Unable to assess due to telehealth visit Patient leans: N/A  Psychiatric Specialty Exam: Review of Systems  There were no vitals taken for this visit.There is no height or weight on file to calculate BMI.  General Appearance: Unable to assess due to telehealth visit  Eye Contact:  Unable to assess due to telehealth visit  Speech:  Clear and Coherent and Normal Rate  Volume:  Normal  Mood:  Euthymic  Affect:  Appropriate and Congruent  Thought Process:  Coherent, Goal Directed and Linear  Orientation:  Full (Time, Place, and Person)  Thought Content: WDL and Logical   Suicidal Thoughts:  No  Homicidal Thoughts:  No  Memory:  Immediate;   Good Recent;   Good Remote;   Good  Judgement:  Good  Insight:  Good  Psychomotor Activity:  Normal  Concentration:  Concentration: Good and Attention Span: Good  Recall:  Good  Fund of Knowledge: Good  Language: Good  Akathisia:  No  Handed:  Right  AIMS (if indicated): not done  Assets:  Communication Skills Desire for Improvement Financial Resources/Insurance Housing Social Support  ADL's:  Intact  Cognition: WNL  Sleep:  Good   Screenings: GAD-7     Counselor from [facility]/15/2019 in [facility]  Total GAD-7 Score 5    PHQ2-9     Counselor from 10/15/[facility] in [facility]  PHQ-2 Total Score 2  PHQ-9 Total Score 4       Assessment and Plan: Patient notes that overall she is doing well on her current medication regimen.  No medication changes made today.  Patient is agreeable to continue all medications as prescribed.  1. Anxiety and depression  Continue-  escitalopram (LEXAPRO) 20 MG tablet; Take 1 tablet (20 mg total) by mouth daily.  Dispense: 30 tablet; Refill: 2 Continue- melatonin 3 MG TABS tablet; Take 1 tablet (3 mg total) by mouth at bedtime.  Dispense: 30 tablet; Refill: 2    Salley Slaughter, NP 02/19/2020, 10:07 AM

## 2020-02-28 ENCOUNTER — Telehealth: Payer: Self-pay

## 2020-02-28 NOTE — Telephone Encounter (Signed)
Patient calls and states she is past due for her yearly mammogram, only has Medicaid Family Planning. Patient's PCP is Dr. Amil Amen, past mammography scholarship recipient. Mammography Scholarship application mailed.

## 2020-04-09 ENCOUNTER — Other Ambulatory Visit: Payer: Self-pay

## 2020-04-09 ENCOUNTER — Ambulatory Visit
Admission: RE | Admit: 2020-04-09 | Discharge: 2020-04-09 | Disposition: A | Discharge status: Home / Self Care | Payer: No Typology Code available for payment source | Source: Ambulatory Visit | Attending: Internal Medicine | Admitting: Internal Medicine

## 2020-04-09 DIAGNOSIS — D0512 Intraductal carcinoma in situ of left breast: Secondary | ICD-10-CM

## 2020-05-21 ENCOUNTER — Telehealth (INDEPENDENT_AMBULATORY_CARE_PROVIDER_SITE_OTHER): Payer: No Payment, Other | Admitting: Psychiatry

## 2020-05-21 ENCOUNTER — Other Ambulatory Visit: Payer: Self-pay

## 2020-05-21 ENCOUNTER — Encounter (HOSPITAL_COMMUNITY): Payer: Self-pay | Admitting: Psychiatry

## 2020-05-21 DIAGNOSIS — F32A Depression, unspecified: Secondary | ICD-10-CM | POA: Diagnosis not present

## 2020-05-21 DIAGNOSIS — F419 Anxiety disorder, unspecified: Secondary | ICD-10-CM | POA: Diagnosis not present

## 2020-05-21 MED ORDER — TRAZODONE HCL 50 MG PO TABS: 50.0000 mg | ORAL_TABLET | Freq: Every evening | ORAL | 2 refills | Status: DC | PRN

## 2020-05-21 MED ORDER — ESCITALOPRAM OXALATE 20 MG PO TABS: 20.0000 mg | ORAL_TABLET | Freq: Every day | ORAL | 2 refills | Status: DC

## 2020-05-21 MED ORDER — MELATONIN 5 MG PO TABS: 5.0000 mg | ORAL_TABLET | Freq: Every day | ORAL | 2 refills | Status: DC

## 2020-05-21 NOTE — Progress Notes (Signed)
[place] MD/PA/NP OP Progress Note Virtual Visit via Telephone Note  I connected with Kristin Rose on 05/21/20 at  1:00 PM EST by telephone and verified that I am speaking with the correct person using two identifiers.  Location: Patient: home Provider: Clinic   I discussed the limitations, risks, security and privacy concerns of performing an evaluation and management service by telephone and the availability of in person appointments. I also discussed with the patient that there may be a patient responsible charge related to this service. The patient expressed understanding and agreed to proceed.   I provided 30 minutes of non-face-to-face time during this encounter.    05/21/2020 12:32 PM Kristin Rose  MRN:  [id number]  Chief Complaint: "Things have been okay but im not sleeping well"  HPI: 60 year old female seen today for follow-up psychiatric evaluation.  She has a psychiatric history of anxiety and depression.  She is currently being managed on Lexapro 20 mg.  She notes that her medications are some what effective in managing her psychiatric conditions, however notes she has poor sleep.    Today she is able to virtually so her exam was done over the phone.  She notes that she has very minimal anxiety and depression.  Provider conducted a GAD-7 and PHQ-9 and patient scored a 5 on both.  She notes that she is having difficulty sleeping.  She informed Probation officer that she sleeps 4 to 5 hours a night.  She reported to Probation officer that Ambien was effective in the past for managing her sleep.  Today she denies SI/HI/VAH or paranoia.  She also endorses having a good appetite.  Patient is agreeable to starting trazodone 25 mg to 50 mg as needed for sleep.  She will continue all other medications as prescribed.  She will follow-up with outpatient counselor for therapy. She will also increase melatonin 3 mg to 5 mg to help manage sleep.  No other concerns noted at this time.  Visit Diagnosis:    ICD-10-CM    1. Anxiety and depression  F41.9 traZODone (DESYREL) 50 MG tablet   F32.A escitalopram (LEXAPRO) 20 MG tablet    melatonin 5 MG TABS    Past Psychiatric History: Anxiety and depression  Past Medical History:  Past Medical History:  Diagnosis Date  . Cancer of left breast [facility]) 01/2004   Treatment with lumpectomy, radiation and in 2008 finished with Tamoxifen.  . Depression   . GERD (gastroesophageal reflux disease)   . Heart murmur    as a child  . Hypertension   . Migraine    "weekly" (11/06/2014)  . Personal history of radiation therapy   . Prediabetes 2019    Past Surgical History:  Procedure Laterality Date  . BREAST BIOPSY Left 01/2004  . BREAST LUMPECTOMY Left 01/2004  . COLONOSCOPY    . DILATION AND CURETTAGE OF UTERUS  1990's  . FRACTURE SURGERY  2016   Left ankle ORIf  . ORIF ANKLE FRACTURE Left 11/05/2014   Procedure: OPEN REDUCTION INTERNAL FIXATION (ORIF) LEFT BIMALLEOLAR ANKLE FRACTURE, SYNDESMOSIS;  Surgeon: Leandrew Koyanagi, MD;  Location: [place];  Service: Orthopedics;  Laterality: Left;  . TUBAL LIGATION  53's    Family Psychiatric History: Brother paranoid schizophrenia  Family History:  Family History  Problem Relation Age of Onset  . Pulmonary embolism Mother   . Diabetes type II Mother   . Hypertension Mother   . Hypertension Father   . Glaucoma Father   . Lung cancer  Father        2005  . Cancer Sister 32       Breast  . Breast cancer Sister   . Diabetes Brother   . Schizophrenia Brother   . Diabetes Daughter        Obese  . Crohn's disease Brother   . Diabetes Brother     Social History:  Social History   Socioeconomic History  . Marital status: Married    Spouse name: Kristin Rose  . Number of children: 2  . Years of education: 56  . Highest education level: High school graduate  Occupational History  . Occupation: Marine scientist work  Tobacco Use  . Smoking status: Current Every Day Smoker    Packs/day: 0.50    Years: 30.00     Pack years: 15.00    Types: Cigarettes  . Smokeless tobacco: Never Used  . Tobacco comment: Has quit with Chantix before--quit for up to 2 years and then goes back to smoking during stress.  Vaping Use  . Vaping Use: Former  Substance and Sexual Activity  . Alcohol use: Not Currently    Comment: "used to drink; stopped ~ 2010"  . Drug use: No  . Sexual activity: Not Currently    Birth control/protection: Surgical  Other Topics Concern  . Not on file  Social History Narrative   Lives with husband.   Husband disabled   She is doing well now that she is working (2020)   Social Determinants of Radio broadcast assistant Strain: Not on file  Food Insecurity: Not on file  Transportation Needs: Not on file  Physical Activity: Not on file  Stress: Not on file  Social Connections: Not on file    Allergies:  Allergies  Allergen Reactions  . Lisinopril Cough  . Percocet [Oxycodone-Acetaminophen] Itching    Metabolic Disorder Labs: Lab Results  Component Value Date   HGBA1C 5.0 05/21/2018   No results found for: PROLACTIN Lab Results  Component Value Date   CHOL 176 03/04/2019   TRIG 94 03/04/2019   HDL 54 03/04/2019   LDLCALC 105 (H) 03/04/2019   LDLCALC 125 (H) 05/21/2018   Lab Results  Component Value Date   TSH 1.250 03/04/2019    Therapeutic Level Labs: No results found for: LITHIUM No results found for: VALPROATE No components found for:  CBMZ  Current Medications: Current Outpatient Medications  Medication Sig Dispense Refill  . traZODone (DESYREL) 50 MG tablet Take 1 tablet (50 mg total) by mouth at bedtime as needed for sleep. 30 tablet 2  . amLODipine (NORVASC) 10 MG tablet Take 1 tablet (10 mg total) by mouth every morning. 30 tablet 11  . aspirin-acetaminophen-caffeine (EXCEDRIN MIGRAINE) [id number] MG per tablet Take 2 tablets by mouth every 8 (eight) hours as needed for headache.     . Aspirin-Salicylamide-Caffeine (BC HEADACHE POWDER PO) Take 1  Package by mouth daily as needed (for pain).    . B Complex-C (SUPER B COMPLEX PO) Take by mouth. 1 daily    . Biotin 10000 MCG TABS Take by mouth. 1 daily    . Cholecalciferol (VITAMIN D3) 50 MCG (2000 UT) TABS Take by mouth. 1 daily    . escitalopram (LEXAPRO) 20 MG tablet Take 1 tablet (20 mg total) by mouth daily. 30 tablet 2  . losartan-hydrochlorothiazide (HYZAAR) 100-25 MG tablet Take 1 tablet by mouth every morning. 30 tablet 11  . melatonin 5 MG TABS Take 1 tablet (5 mg total)  by mouth at bedtime. 30 tablet 2  . metoprolol tartrate (LOPRESSOR) 100 MG tablet Take 1 tablet (100 mg total) by mouth 2 (two) times daily. 60 tablet 11  . Misc Natural Products (NEURIVA PO) Take by mouth. 1 daily    . mometasone (NASONEX) 50 MCG/ACT nasal spray 2 SPRAYS IN EACH NOSTRIL ONCE DAILY 17 g 0  . Olopatadine HCl (PAZEO) 0.7 % SOLN Apply 1 drop to eye daily. 1 drop in each eye daily 1 Bottle 11  . omeprazole (PRILOSEC) 20 MG capsule Take 1 capsule (20 mg total) by mouth daily. 30 capsule 11  . ondansetron (ZOFRAN-ODT) 8 MG disintegrating tablet Take 1 tablet (8 mg total) by mouth every 8 (eight) hours as needed for nausea. (Patient not taking: Reported on 01/28/2020) 12 tablet 0  . OVER THE COUNTER MEDICATION Nature's Bounty sleep Aide. 1 daily at bedtime    . SUMAtriptan (IMITREX) 50 MG tablet 1 tab by mouth for migraine pain, may repeat in 2 hours if not relieved, max 200 mg in 24 hours. 9 tablet 6  . vitamin E 1000 UNIT capsule Take 1,000 Units by mouth daily.    Marland Kitchen zinc gluconate 50 MG tablet Take 50 mg by mouth daily.     No current facility-administered medications for this visit.     Musculoskeletal: Strength & Muscle Tone: Unable to assess due to telephone visit Gait & Station: Unable to assess due to telephone visit Patient leans: N/A  Psychiatric Specialty Exam: Review of Systems  There were no vitals taken for this visit.There is no height or weight on file to calculate BMI.  General  Appearance: Unable to assess due to telephone visit  Eye Contact:  Unable to assess due to telephone visit  Speech:  Clear and Coherent and Normal Rate  Volume:  Normal  Mood:  Euthymic  Affect:  Appropriate and Congruent  Thought Process:  Coherent, Goal Directed and Linear  Orientation:  Full (Time, Place, and Person)  Thought Content: WDL and Logical   Suicidal Thoughts:  No  Homicidal Thoughts:  No  Memory:  Immediate;   Good Recent;   Good Remote;   Good  Judgement:  Good  Insight:  Good  Psychomotor Activity:  Normal  Concentration:  Concentration: Good and Attention Span: Good  Recall:  Good  Fund of Knowledge: Good  Language: Good  Akathisia:  No  Handed:  Right  AIMS (if indicated): not done  Assets:  Communication Skills Desire for Improvement Financial Resources/Insurance Housing Social Support  ADL's:  Intact  Cognition: WNL  Sleep:  Fair   Screenings: GAD-7   Flowsheet Row Video Visit from 05/21/2020 in [facility] Counselor from 10/15/2019 in [facility]  Total GAD-7 Score 5 5    PHQ2-9   Flowsheet Row Video Visit from 05/21/2020 in [facility] Counselor from 10/15/2019 in [facility]  PHQ-2 Total Score 1 2  PHQ-9 Total Score 5 4       Assessment and Plan: Patient notes that overall she is doing well on her current medication regimen.  Reports she is having difficulty sleeping.  She is agreeable to starting trazodone 25 mg to 50 mg to help manage sleep.  She will also increase melatonin 3 mg to 5 mg to help manage sleep.  No medication changes made today.  Patient is agreeable to continue all medications as prescribed.  1. Anxiety and depression  Start- traZODone (  DESYREL) 50 MG tablet; Take 1 tablet (50 mg total) by mouth at bedtime as needed for sleep.  Dispense: 30 tablet; Refill: 2 Continue- escitalopram (LEXAPRO) 20 MG tablet; Take 1  tablet (20 mg total) by mouth daily.  Dispense: 30 tablet; Refill: 2 Increased- melatonin 5 MG TABS; Take 1 tablet (5 mg total) by mouth at bedtime.  Dispense: 30 tablet; Refill: 2  Follow-up in 3 months  Salley Slaughter, NP 05/21/2020, 12:32 PM

## 2020-07-29 ENCOUNTER — Encounter: Payer: Medicaid Other | Admitting: Internal Medicine

## 2020-08-19 ENCOUNTER — Telehealth (INDEPENDENT_AMBULATORY_CARE_PROVIDER_SITE_OTHER): Payer: No Payment, Other | Admitting: Psychiatry

## 2020-08-19 ENCOUNTER — Encounter (HOSPITAL_COMMUNITY): Payer: Self-pay | Admitting: Psychiatry

## 2020-08-19 ENCOUNTER — Other Ambulatory Visit: Payer: Self-pay

## 2020-08-19 DIAGNOSIS — F320 Major depressive disorder, single episode, mild: Secondary | ICD-10-CM | POA: Diagnosis not present

## 2020-08-19 DIAGNOSIS — F411 Generalized anxiety disorder: Secondary | ICD-10-CM

## 2020-08-19 DIAGNOSIS — F32A Depression, unspecified: Secondary | ICD-10-CM

## 2020-08-19 MED ORDER — ESCITALOPRAM OXALATE 20 MG PO TABS: 20.0000 mg | ORAL_TABLET | Freq: Every day | ORAL | 2 refills | Status: DC

## 2020-08-19 NOTE — Progress Notes (Signed)
[place] MD/PA/NP OP Progress Note Virtual Visit via Telephone Note  I connected with Kristin Rose on 08/19/20 at  2:00 PM EDT by telephone and verified that I am speaking with the correct person using two identifiers.  Location: Patient: home Provider: Clinic   I discussed the limitations, risks, security and privacy concerns of performing an evaluation and management service by telephone and the availability of in person appointments. I also discussed with the patient that there may be a patient responsible charge related to this service. The patient expressed understanding and agreed to proceed.   I provided 30 minutes of non-face-to-face time during this encounter.    08/19/2020 2:42 PM Kristin Rose  MRN:  [id number]  Chief Complaint: "I have a lot going on"  HPI: 60 year old female seen today for follow-up psychiatric evaluation.  She has a psychiatric history of anxiety and depression.  She is currently being managed on Lexapro 20 mg, trazodone 25 to 50 mg as needed nightly, and melatonin 5 mg nightly as needed.  She notes that she never started her trazodone because her PCP prescribed her Ambien.  She notes that she discontinued melatonin and reports her Lexapro is managing her other psychiatric conditions well.      Today she was unable to login virtually so her exam was done over the phone.  She informed Probation officer that she has a lot going on.  She notes that last Friday her mother-in-law passed away and she is trying to support her husband through this difficult time.  She notes that her husband family has drama going on that she is trying to mediate which is becoming overwhelming.  Patient also notes that she received a permanent position at the [employer] and notes that she now works third shift.  She informed Probation officer that since starting Ambien she has been able to maintain 6 to 7 hours of sleep a day.   Due to the above stressors patient notes that her anxiety has worsened since her last  visit.  Provider conducted a GAD-7 and patient scored a 14, at her last visit she scored a 5.  Provider also conducted a PHQ-9 and patient scored a 6, at her last visit she scored a 5.  Today she denies SI/HI/VAH or paranoia.  She also endorses having a good appetite.  Provider recommended that patient follow-up with therapist to help her cope with above stressors.  At this time she notes that she is too busy and does not want to start therapy.  At this time she notes that she does not want to restart trazodone or melatonin.  She will continue Lexapro as prescribed.  No other concerns noted at this time.  Visit Diagnosis:    ICD-10-CM   1. Generalized anxiety disorder  F41.1 escitalopram (LEXAPRO) 20 MG tablet  2. Mild depression (HCC)  F32.0 escitalopram (LEXAPRO) 20 MG tablet    Past Psychiatric History: Anxiety and depression  Past Medical History:  Past Medical History:  Diagnosis Date  . Cancer of left breast [facility]) 01/2004   Treatment with lumpectomy, radiation and in 2008 finished with Tamoxifen.  . Depression   . GERD (gastroesophageal reflux disease)   . Heart murmur    as a child  . Hypertension   . Migraine    "weekly" (11/06/2014)  . Personal history of radiation therapy   . Prediabetes 2019    Past Surgical History:  Procedure Laterality Date  . BREAST BIOPSY Left 01/2004  . BREAST LUMPECTOMY  Left 01/2004  . COLONOSCOPY    . DILATION AND CURETTAGE OF UTERUS  1990's  . FRACTURE SURGERY  2016   Left ankle ORIf  . ORIF ANKLE FRACTURE Left 11/05/2014   Procedure: OPEN REDUCTION INTERNAL FIXATION (ORIF) LEFT BIMALLEOLAR ANKLE FRACTURE, SYNDESMOSIS;  Surgeon: Leandrew Koyanagi, MD;  Location: [place];  Service: Orthopedics;  Laterality: Left;  . TUBAL LIGATION  61's    Family Psychiatric History: Brother paranoid schizophrenia  Family History:  Family History  Problem Relation Age of Onset  . Pulmonary embolism Mother   . Diabetes type II Mother   . Hypertension Mother   .  Hypertension Father   . Glaucoma Father   . Lung cancer Father        2005  . Cancer Sister 63       Breast  . Breast cancer Sister   . Diabetes Brother   . Schizophrenia Brother   . Diabetes Daughter        Obese  . Crohn's disease Brother   . Diabetes Brother     Social History:  Social History   Socioeconomic History  . Marital status: Married    Spouse name: Jahnavi Muratore  . Number of children: 2  . Years of education: 24  . Highest education level: High school graduate  Occupational History  . Occupation: Marine scientist work  Tobacco Use  . Smoking status: Current Every Day Smoker    Packs/day: 0.50    Years: 30.00    Pack years: 15.00    Types: Cigarettes  . Smokeless tobacco: Never Used  . Tobacco comment: Has quit with Chantix before--quit for up to 2 years and then goes back to smoking during stress.  Vaping Use  . Vaping Use: Former  Substance and Sexual Activity  . Alcohol use: Not Currently    Comment: "used to drink; stopped ~ 2010"  . Drug use: No  . Sexual activity: Not Currently    Birth control/protection: Surgical  Other Topics Concern  . Not on file  Social History Narrative   Lives with husband.   Husband disabled   She is doing well now that she is working (2020)   Social Determinants of Radio broadcast assistant Strain: Not on file  Food Insecurity: Not on file  Transportation Needs: Not on file  Physical Activity: Not on file  Stress: Not on file  Social Connections: Not on file    Allergies:  Allergies  Allergen Reactions  . Lisinopril Cough  . Percocet [Oxycodone-Acetaminophen] Itching    Metabolic Disorder Labs: Lab Results  Component Value Date   HGBA1C 5.0 05/21/2018   No results found for: PROLACTIN Lab Results  Component Value Date   CHOL 176 03/04/2019   TRIG 94 03/04/2019   HDL 54 03/04/2019   LDLCALC 105 (H) 03/04/2019   LDLCALC 125 (H) 05/21/2018   Lab Results  Component Value Date   TSH 1.250 03/04/2019     Therapeutic Level Labs: No results found for: LITHIUM No results found for: VALPROATE No components found for:  CBMZ  Current Medications: Current Outpatient Medications  Medication Sig Dispense Refill  . amLODipine (NORVASC) 10 MG tablet Take 1 tablet (10 mg total) by mouth every morning. 30 tablet 11  . aspirin-acetaminophen-caffeine (EXCEDRIN MIGRAINE) 433-295-18 MG per tablet Take 2 tablets by mouth every 8 (eight) hours as needed for headache.     . Aspirin-Salicylamide-Caffeine (BC HEADACHE POWDER PO) Take 1 Package by mouth daily as  needed (for pain).    . B Complex-C (SUPER B COMPLEX PO) Take by mouth. 1 daily    . Biotin 10000 MCG TABS Take by mouth. 1 daily    . Cholecalciferol (VITAMIN D3) 50 MCG (2000 UT) TABS Take by mouth. 1 daily    . escitalopram (LEXAPRO) 20 MG tablet Take 1 tablet (20 mg total) by mouth daily. 30 tablet 2  . losartan-hydrochlorothiazide (HYZAAR) 100-25 MG tablet Take 1 tablet by mouth every morning. 30 tablet 11  . metoprolol tartrate (LOPRESSOR) 100 MG tablet Take 1 tablet (100 mg total) by mouth 2 (two) times daily. 60 tablet 11  . Misc Natural Products (NEURIVA PO) Take by mouth. 1 daily    . mometasone (NASONEX) 50 MCG/ACT nasal spray 2 SPRAYS IN EACH NOSTRIL ONCE DAILY 17 g 0  . Olopatadine HCl (PAZEO) 0.7 % SOLN Apply 1 drop to eye daily. 1 drop in each eye daily 1 Bottle 11  . omeprazole (PRILOSEC) 20 MG capsule Take 1 capsule (20 mg total) by mouth daily. 30 capsule 11  . ondansetron (ZOFRAN-ODT) 8 MG disintegrating tablet Take 1 tablet (8 mg total) by mouth every 8 (eight) hours as needed for nausea. (Patient not taking: Reported on 01/28/2020) 12 tablet 0  . OVER THE COUNTER MEDICATION Nature's Bounty sleep Aide. 1 daily at bedtime    . SUMAtriptan (IMITREX) 50 MG tablet 1 tab by mouth for migraine pain, may repeat in 2 hours if not relieved, max 200 mg in 24 hours. 9 tablet 6  . vitamin E 1000 UNIT capsule Take 1,000 Units by mouth daily.     Marland Kitchen zinc gluconate 50 MG tablet Take 50 mg by mouth daily.     No current facility-administered medications for this visit.     Musculoskeletal: Strength & Muscle Tone: Unable to assess due to telephone visit Gait & Station: Unable to assess due to telephone visit Patient leans: N/A  Psychiatric Specialty Exam: Review of Systems  There were no vitals taken for this visit.There is no height or weight on file to calculate BMI.  General Appearance: Unable to assess due to telephone visit  Eye Contact:  Unable to assess due to telephone visit  Speech:  Clear and Coherent and Normal Rate  Volume:  Normal  Mood:  Anxious  Affect:  Appropriate and Congruent  Thought Process:  Coherent, Goal Directed and Linear  Orientation:  Full (Time, Place, and Person)  Thought Content: WDL and Logical   Suicidal Thoughts:  No  Homicidal Thoughts:  No  Memory:  Immediate;   Good Recent;   Good Remote;   Good  Judgement:  Good  Insight:  Good  Psychomotor Activity:  Normal  Concentration:  Concentration: Good and Attention Span: Good  Recall:  Good  Fund of Knowledge: Good  Language: Good  Akathisia:  No  Handed:  Right  AIMS (if indicated): not done  Assets:  Communication Skills Desire for Improvement Financial Resources/Insurance Housing Social Support  ADL's:  Intact  Cognition: WNL  Sleep:  Good   Screenings: GAD-7   Flowsheet Row Video Visit from 08/19/2020 in [facility] Video Visit from 05/21/2020 in [facility] Counselor from 10/15/2019 in [facility]  Total GAD-7 Score 14 5 5     PHQ2-9   Flowsheet Row Video Visit from 08/19/2020 in [facility] Video Visit from 05/21/2020 in [facility] Counselor from 10/15/2019 in [facility]  PHQ-2 Total Score 3 1 2   PHQ-9 Total Score 6 5 4     Flowsheet Row Video Visit from  08/19/2020 in [place] [facility]       Assessment and Plan: Patient notes he has increased anxiety due to life stressors (work, passing of by now, andfamily issues). Provider recommended that patient follow-up with therapist to help her cope with stressors.  At this time she notes that she is too busy and does not want to start therapy.  At this time she notes that she does not want to restart trazodone or melatonin.  She will continue Lexapro as prescribed  1. Generalized anxiety disorder  Continue- escitalopram (LEXAPRO) 20 MG tablet; Take 1 tablet (20 mg total) by mouth daily.  Dispense: 30 tablet; Refill: 2  2. Mild depression (HCC)  Continue- escitalopram (LEXAPRO) 20 MG tablet; Take 1 tablet (20 mg total) by mouth daily.  Dispense: 30 tablet; Refill: 2   Follow-up in 3 months  Salley Slaughter, NP 08/19/2020, 2:42 PM

## 2020-11-18 ENCOUNTER — Other Ambulatory Visit: Payer: Self-pay

## 2020-11-18 ENCOUNTER — Encounter (HOSPITAL_COMMUNITY): Payer: Self-pay | Admitting: Psychiatry

## 2020-11-18 ENCOUNTER — Telehealth (INDEPENDENT_AMBULATORY_CARE_PROVIDER_SITE_OTHER): Payer: Federal, State, Local not specified - PPO | Admitting: Psychiatry

## 2020-11-18 DIAGNOSIS — F32A Depression, unspecified: Secondary | ICD-10-CM

## 2020-11-18 DIAGNOSIS — F411 Generalized anxiety disorder: Secondary | ICD-10-CM | POA: Diagnosis not present

## 2020-11-18 DIAGNOSIS — F320 Major depressive disorder, single episode, mild: Secondary | ICD-10-CM

## 2020-11-18 MED ORDER — ESCITALOPRAM OXALATE 20 MG PO TABS: 20.0000 mg | ORAL_TABLET | Freq: Every day | ORAL | 2 refills | Status: DC

## 2020-11-18 NOTE — Progress Notes (Signed)
[place] MD/PA/NP OP Progress Note Virtual Visit via Telephone Note  I connected with Kristin Rose on 11/18/20 at  2:00 PM EDT by telephone and verified that I am speaking with the correct person using two identifiers.  Location: Patient: home Provider: Clinic   I discussed the limitations, risks, security and privacy concerns of performing an evaluation and management service by telephone and the availability of in person appointments. I also discussed with the patient that there may be a patient responsible charge related to this service. The patient expressed understanding and agreed to proceed.   I provided 30 minutes of non-face-to-face time during this encounter.    11/18/2020 2:03 PM MAURINE Kristin Rose  MRN:  [id number]  Chief Complaint: "My meds are going well"  HPI: 60 year old female seen today for follow up of psychiatric evaluation. She has a psychiatric history of depression and anxiety. She is currently managed on Lexapro 20mg  and Ambien (provided by PCP).  Patient was unable to log onto video platform and the appointment today was conducted via phone. On the phone she is pleasant, cooperative, engaged in conversation. Patient feels that her current medication regimen is working well for depression and anxiety. She continues to deal with the stress of caring for family members, however states that everyone is doing better since the passing of her mother in law. She is sleeping 6-7 hours nightly with Ambien. She endorses normal appetite and stable weight. She reports developing chronic joint pain in the past 2 months, rated 6/10 and states that she is seeing her PCP for this. She also has a referral in place to a specialist. Today denies visual or auditory hallucinations, paranoia, mania.  Provider conducted GAD-7 today scoring 1, last visit scored 10. Provider also conducted PHQ-9 today scoring 1, last visit scored 6.  No medication changes made today. Patient agreeable to current  management. No other acute concerns today.  Visit Diagnosis:    ICD-10-CM   1. Generalized anxiety disorder  F41.1 escitalopram (LEXAPRO) 20 MG tablet    2. Mild depression (HCC)  F32.0 escitalopram (LEXAPRO) 20 MG tablet      Past Psychiatric History: Anxiety and depression  Past Medical History:  Past Medical History:  Diagnosis Date   Cancer of left breast ([place]) 01/2004   Treatment with lumpectomy, radiation and in 2008 finished with Tamoxifen.   Depression    GERD (gastroesophageal reflux disease)    Heart murmur    as a child   Hypertension    Migraine    "weekly" (11/06/2014)   Personal history of radiation therapy    Prediabetes 2019    Past Surgical History:  Procedure Laterality Date   BREAST BIOPSY Left 01/2004   BREAST LUMPECTOMY Left 01/2004   COLONOSCOPY     DILATION AND CURETTAGE OF UTERUS  1990's   FRACTURE SURGERY  2016   Left ankle ORIf   ORIF ANKLE FRACTURE Left 11/05/2014   Procedure: OPEN REDUCTION INTERNAL FIXATION (ORIF) LEFT BIMALLEOLAR ANKLE FRACTURE, SYNDESMOSIS;  Surgeon: Leandrew Koyanagi, MD;  Location: [place];  Service: Orthopedics;  Laterality: Left;   TUBAL LIGATION  49's    Family Psychiatric History: Brother paranoid schizophrenia  Family History:  Family History  Problem Relation Age of Onset   Pulmonary embolism Mother    Diabetes type II Mother    Hypertension Mother    Hypertension Father    Glaucoma Father    Lung cancer Father  2005   Cancer Sister 61       Breast   Breast cancer Sister    Diabetes Brother    Schizophrenia Brother    Diabetes Daughter        Obese   Crohn's disease Brother    Diabetes Brother     Social History:  Social History   Socioeconomic History   Marital status: Married    Spouse name: Kristin Rose   Number of children: 2   Years of education: 12   Highest education level: High school graduate  Occupational History   Occupation: Marine scientist work  Tobacco Use   Smoking status: Every  Day    Packs/day: 0.50    Years: 30.00    Pack years: 15.00    Types: Cigarettes   Smokeless tobacco: Never   Tobacco comments:    Has quit with Chantix before--quit for up to 2 years and then goes back to smoking during stress.  Vaping Use   Vaping Use: Former  Substance and Sexual Activity   Alcohol use: Not Currently    Comment: "used to drink; stopped ~ 2010"   Drug use: No   Sexual activity: Not Currently    Birth control/protection: Surgical  Other Topics Concern   Not on file  Social History Narrative   Lives with husband.   Husband disabled   She is doing well now that she is working (2020)   Social Determinants of Radio broadcast assistant Strain: Not on file  Food Insecurity: Not on file  Transportation Needs: Not on file  Physical Activity: Not on file  Stress: Not on file  Social Connections: Not on file    Allergies:  Allergies  Allergen Reactions   Lisinopril Cough   Percocet [Oxycodone-Acetaminophen] Itching    Metabolic Disorder Labs: Lab Results  Component Value Date   HGBA1C 5.0 05/21/2018   No results found for: PROLACTIN Lab Results  Component Value Date   CHOL 176 03/04/2019   TRIG 94 03/04/2019   HDL 54 03/04/2019   LDLCALC 105 (H) 03/04/2019   LDLCALC 125 (H) 05/21/2018   Lab Results  Component Value Date   TSH 1.250 03/04/2019    Therapeutic Level Labs: No results found for: LITHIUM No results found for: VALPROATE No components found for:  CBMZ  Current Medications: Current Outpatient Medications  Medication Sig Dispense Refill   amLODipine (NORVASC) 10 MG tablet Take 1 tablet (10 mg total) by mouth every morning. 30 tablet 11   aspirin-acetaminophen-caffeine (EXCEDRIN MIGRAINE) 053-976-73 MG per tablet Take 2 tablets by mouth every 8 (eight) hours as needed for headache.      Aspirin-Salicylamide-Caffeine (BC HEADACHE POWDER PO) Take 1 Package by mouth daily as needed (for pain).     B Complex-C (SUPER B COMPLEX PO) Take  by mouth. 1 daily     Biotin 10000 MCG TABS Take by mouth. 1 daily     Cholecalciferol (VITAMIN D3) 50 MCG (2000 UT) TABS Take by mouth. 1 daily     escitalopram (LEXAPRO) 20 MG tablet Take 1 tablet (20 mg total) by mouth daily. 30 tablet 2   losartan-hydrochlorothiazide (HYZAAR) 100-25 MG tablet Take 1 tablet by mouth every morning. 30 tablet 11   metoprolol tartrate (LOPRESSOR) 100 MG tablet Take 1 tablet (100 mg total) by mouth 2 (two) times daily. 60 tablet 11   Misc Natural Products (NEURIVA PO) Take by mouth. 1 daily     mometasone (NASONEX) 50 MCG/ACT nasal  spray 2 SPRAYS IN EACH NOSTRIL ONCE DAILY 17 g 0   Olopatadine HCl (PAZEO) 0.7 % SOLN Apply 1 drop to eye daily. 1 drop in each eye daily 1 Bottle 11   omeprazole (PRILOSEC) 20 MG capsule Take 1 capsule (20 mg total) by mouth daily. 30 capsule 11   ondansetron (ZOFRAN-ODT) 8 MG disintegrating tablet Take 1 tablet (8 mg total) by mouth every 8 (eight) hours as needed for nausea. (Patient not taking: Reported on 01/28/2020) 12 tablet 0   OVER THE COUNTER MEDICATION Nature's Bounty sleep Aide. 1 daily at bedtime     SUMAtriptan (IMITREX) 50 MG tablet 1 tab by mouth for migraine pain, may repeat in 2 hours if not relieved, max 200 mg in 24 hours. 9 tablet 6   vitamin E 1000 UNIT capsule Take 1,000 Units by mouth daily.     zinc gluconate 50 MG tablet Take 50 mg by mouth daily.     No current facility-administered medications for this visit.     Musculoskeletal: Strength & Muscle Tone:  Unable to assess due to telephone visit Gait & Station:  Unable to assess due to telephone visit Patient leans: N/A  Psychiatric Specialty Exam: Review of Systems  There were no vitals taken for this visit.There is no height or weight on file to calculate BMI.  General Appearance:  Unable to assess due to telephone visit  Eye Contact:   Unable to assess due to telephone visit  Speech:  Clear and Coherent and Normal Rate  Volume:  Normal  Mood:   Euthymic  Affect:  Appropriate and Congruent  Thought Process:  Coherent, Goal Directed and Linear  Orientation:  Full (Time, Place, and Person)  Thought Content: WDL and Logical   Suicidal Thoughts:  No  Homicidal Thoughts:  No  Memory:  Immediate;   Good Recent;   Good Remote;   Good  Judgement:  Good  Insight:  Good  Psychomotor Activity:  Normal  Concentration:  Concentration: Good and Attention Span: Good  Recall:  Good  Fund of Knowledge: Good  Language: Good  Akathisia:  No  Handed:  Right  AIMS (if indicated): not done  Assets:  Communication Skills Desire for Improvement Financial Resources/Insurance Housing Social Support  ADL's:  Intact  Cognition: WNL  Sleep:  Good   Screenings: GAD-7    Flowsheet Row Video Visit from 11/18/2020 in [facility] Video Visit from 08/19/2020 in [facility] Video Visit from 05/21/2020 in [facility] Counselor from 10/15/2019 in [facility]  Total GAD-7 Score 2 14 5 5       PHQ2-9    Flowsheet Row Video Visit from 11/18/2020 in [facility] Video Visit from 08/19/2020 in [facility] Video Visit from 05/21/2020 in [facility] Counselor from 10/15/2019 in [facility]  PHQ-2 Total Score 0 3 1 2   PHQ-9 Total Score 1 6 5 4       Flowsheet Row Video Visit from 08/19/2020 in [place] No Risk        Assessment and Plan: Patient notes her anxiety and depression has improved since her last visit. No medication changes made today. Patient agreeable to continue medications as prescribed.  1. Generalized anxiety disorder  Continue- escitalopram (LEXAPRO) 20 MG tablet; Take 1 tablet (20 mg total) by mouth daily.  Dispense: 30  tablet; Refill: 2  2. Mild depression  (HCC)  Continue- escitalopram (LEXAPRO) 20 MG tablet; Take 1 tablet (20 mg total) by mouth daily.  Dispense: 30 tablet; Refill: 2   Follow-up in 3 months  Salley Slaughter, NP 11/18/2020, 2:03 PM

## 2020-11-29 ENCOUNTER — Ambulatory Visit (HOSPITAL_COMMUNITY)
Admission: EM | Admit: 2020-11-29 | Discharge: 2020-11-29 | Disposition: A | Discharge status: Home / Self Care | Payer: Federal, State, Local not specified - PPO

## 2020-11-29 ENCOUNTER — Encounter (HOSPITAL_COMMUNITY): Payer: Self-pay

## 2020-11-29 ENCOUNTER — Other Ambulatory Visit: Payer: Self-pay

## 2020-11-29 DIAGNOSIS — M25561 Pain in right knee: Secondary | ICD-10-CM

## 2020-11-29 DIAGNOSIS — M5432 Sciatica, left side: Secondary | ICD-10-CM | POA: Diagnosis not present

## 2020-11-29 MED ORDER — PREDNISONE 20 MG PO TABS: 40.0000 mg | ORAL_TABLET | Freq: Every day | ORAL | 0 refills | Status: AC

## 2020-11-29 NOTE — ED Triage Notes (Signed)
Pt c/o pain in left hip that radiates up back, and right knee. Pt states she bends a lot at work. Has been going on for a month now.  Interventions: Cyclobenzaprine- some what helpful, diclofenac sodium- not helpful

## 2020-11-29 NOTE — Discharge Instructions (Addendum)
Take the prednisone daily for the next 5 days.    Rest as much as possible and drink plenty of fluids.  Ice for 10-15 minutes every 4-6 hours as needed for pain and swelling. You can also use heat or alternate between heat and ice.   You can continue to take the muscle relaxer as previously prescribed and use the biofreeze.   Follow up with orthopedics as scheduled.

## 2020-11-29 NOTE — ED Provider Notes (Signed)
[place]    CSN: [id number] Arrival date & time: 11/29/20  1003      History   Chief Complaint Chief Complaint  Patient presents with   Leg Pain    HPI TERSEA MOSHER is a 60 y.o. female.   Patient here for evaluation of left back and hip pain that has been ongoing for the past month and right knee pain that has been ongoing for the past 3 months.  Patient has been seen and evaluated and encouraged to follow-up with orthopedics but appointment is not until August.  Reports taking diclofenac which has not been helpful and cyclobenazprine with some relief.  Denies any trauma, injury, or other precipitating event.  Reports pain achy and constant but worse with movement.  Denies any fevers, chest pain, shortness of breath, N/V/D, numbness, tingling, weakness, abdominal pain, or headaches.    The history is provided by the patient.  Leg Pain Associated symptoms: back pain    Past Medical History:  Diagnosis Date   Cancer of left breast ([place]) 01/2004   Treatment with lumpectomy, radiation and in 2008 finished with Tamoxifen.   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   Generalized anxiety disorder 10/15/2019   Vitreous floaters of both eyes 03/04/2019   Dental decay 03/04/2019   Hypercholesterolemia 03/04/2019   Tinea pedis of both feet 03/04/2019   Onychomycosis 03/04/2019   Thyromegaly 03/04/2019   Prediabetes 05/21/2018   Anxiety and depression 05/21/2018   Heart murmur    S/P ORIF (open reduction internal fixation) fracture 11/05/2014   Chest pain of uncertain etiology 99991111   Essential hypertension 01/16/2014   Tobacco abuse 01/16/2014   Cancer  of left breast ([place]) 01/2004    Past Surgical History:  Procedure Laterality Date   BREAST BIOPSY Left 01/2004   BREAST LUMPECTOMY Left 01/2004   COLONOSCOPY     DILATION AND CURETTAGE OF UTERUS  1990's   FRACTURE SURGERY  2016   Left ankle ORIf   ORIF ANKLE FRACTURE Left 11/05/2014   Procedure: OPEN REDUCTION INTERNAL FIXATION (ORIF) LEFT BIMALLEOLAR ANKLE FRACTURE, SYNDESMOSIS;  Surgeon: Leandrew Koyanagi, MD;  Location: [facility];  Service: Orthopedics;  Laterality: Left;   TUBAL LIGATION  1990's    OB History   No obstetric history on file.      Home Medications    Prior to Admission medications   Medication Sig Start Date End Date Taking? Authorizing Provider  cyclobenzaprine (FLEXERIL) 10 MG tablet Take 10 mg by mouth 3 (three) times daily. 10/28/20  Yes [provider]  predniSONE (DELTASONE) 20 MG tablet Take 2 tablets (40 mg total) by mouth daily for 5 days. 11/29/20 12/04/20 Yes Pearson Forster, NP  amLODipine (NORVASC) 10 MG tablet Take 1 tablet (10 mg total) by mouth every morning. 01/28/20   Mack Hook, MD  aspirin-acetaminophen-caffeine (EXCEDRIN MIGRAINE) [phone] MG per tablet Take 2 tablets by mouth every 8 (eight) hours as needed for headache.     [provider]  Aspirin-Salicylamide-Caffeine (BC HEADACHE POWDER PO) Take 1 Package by mouth daily as needed (for pain).    [provider]  B Complex-C (SUPER B COMPLEX PO) Take by mouth. 1 daily    [provider]  Biotin 10000 MCG TABS Take by mouth. 1 daily    [provider]  Cholecalciferol (VITAMIN D3) 50 MCG (2000 UT) TABS Take by mouth. 1 daily    [provider]  escitalopram (LEXAPRO) 20 MG tablet Take 1 tablet (20 mg total) by mouth daily. 11/18/20   Salley Slaughter, NP  losartan-hydrochlorothiazide [facility]) 100-25 MG tablet Take 1 tablet by mouth every morning. 01/28/20   Mack Hook, MD  metoprolol tartrate (LOPRESSOR) 100 MG tablet Take 1 tablet  (100 mg total) by mouth 2 (two) times daily. 01/28/20   Mack Hook, MD  Misc Natural Products (NEURIVA PO) Take by mouth. 1 daily    [provider]  mometasone (NASONEX) 50 MCG/ACT nasal spray 2 SPRAYS IN EACH NOSTRIL ONCE DAILY 07/09/19   Mack Hook, MD  Olopatadine HCl (PAZEO) 0.7 % SOLN Apply 1 drop to eye daily. 1 drop in each eye daily 08/02/18   Mack Hook, MD  omeprazole (PRILOSEC) 20 MG capsule Take 1 capsule (20 mg total) by mouth daily. 01/28/20   Mack Hook, MD  ondansetron (ZOFRAN-ODT) 8 MG disintegrating tablet Take 1 tablet (8 mg total) by mouth every 8 (eight) hours as needed for nausea. Patient not taking: Reported on 01/28/2020 09/01/19   Robyn Haber, MD  OVER THE COUNTER MEDICATION [employer] sleep Aide. 1 daily at bedtime    [provider]  SUMAtriptan (IMITREX) 50 MG tablet 1 tab by mouth for migraine pain, may repeat in 2 hours if not relieved, max 200 mg in 24 hours. 03/04/19   Mack Hook, MD  vitamin E 1000 UNIT capsule Take 1,000 Units by mouth daily.    [provider]  zinc gluconate 50 MG tablet Take 50 mg by mouth daily.    [provider]    Family History Family History  Problem Relation Age of Onset   Pulmonary embolism Mother    Diabetes type II Mother    Hypertension Mother    Hypertension Father    Glaucoma Father    Lung cancer Father        2005   Cancer Sister 63       Breast   Breast cancer Sister    Diabetes Brother    Schizophrenia Brother    Diabetes Daughter        Obese   Crohn's disease Brother    Diabetes Brother     Social History Social History   Tobacco Use   Smoking status: Every Day    Packs/day: 0.50    Years: 30.00    Pack years: 15.00    Types: Cigarettes   Smokeless tobacco: Never   Tobacco comments:    Has quit with Chantix before--quit for up to 2 years and then goes back to smoking during stress.  Vaping Use   Vaping Use: Former   Substance Use Topics   Alcohol use: Not Currently    Comment: "used to drink; stopped ~ 2010"   Drug use: No     Allergies   Lisinopril and Percocet [oxycodone-acetaminophen]   Review of Systems Review of Systems  Musculoskeletal:  Positive for arthralgias, back pain and myalgias.  All other systems reviewed and are negative.   Physical Exam Triage Vital Signs ED Triage Vitals  Enc Vitals Group     BP 11/29/20 1030 (!) 170/96  Pulse Rate 11/29/20 1030 65     Resp 11/29/20 1030 18     Temp 11/29/20 1030 98 F (36.7 C)     Temp Source 11/29/20 1030 Oral     SpO2 11/29/20 1030 97 %     Weight --      Height --      Head Circumference --      Peak Flow --      Pain Score 11/29/20 1026 8     Pain Loc --      Pain Edu? --      Excl. in [place]? --    No data found.  Updated Vital Signs BP (!) 170/96 (BP Location: Left Arm)   Pulse 65   Temp 98 F (36.7 C) (Oral)   Resp 18   LMP  (LMP Unknown)   SpO2 97%   Visual Acuity Right Eye Distance:   Left Eye Distance:   Bilateral Distance:    Right Eye Near:   Left Eye Near:    Bilateral Near:     Physical Exam Vitals and nursing note reviewed.  Constitutional:      General: She is not in acute distress.    Appearance: Normal appearance. She is not ill-appearing, toxic-appearing or diaphoretic.  HENT:     Head: Normocephalic and atraumatic.  Eyes:     Conjunctiva/sclera: Conjunctivae normal.  Cardiovascular:     Rate and Rhythm: Normal rate.     Pulses: Normal pulses.  Pulmonary:     Effort: Pulmonary effort is normal.  Abdominal:     General: Abdomen is flat.  Musculoskeletal:     Cervical back: Normal and normal range of motion. No bony tenderness.     Thoracic back: Normal. No bony tenderness.     Lumbar back: Tenderness present. No bony tenderness. Positive right straight leg raise test. Negative left straight leg raise test.  Skin:    General: Skin is warm and dry.  Neurological:     General: No  focal deficit present.     Mental Status: She is alert and oriented to person, place, and time.  Psychiatric:        Mood and Affect: Mood normal.     UC Treatments / Results  Labs (all labs ordered are listed, but only abnormal results are displayed) Labs Reviewed - No data to display  EKG   Radiology No results found.  Procedures Procedures (including critical care time)  Medications Ordered in UC Medications - No data to display  Initial Impression / Assessment and Plan / UC Course  I have reviewed the triage vital signs and the nursing notes.  Pertinent labs & imaging results that were available during my care of the patient were reviewed by me and considered in my medical decision making (see chart for details).    Assessment negative for red flags or concerns.  Likely sciatica of the left side and acute pain of the right knee.  We will treat with prednisone daily for the next 5 days.  Encourage fluids and rest.  May use heat, ice, or alternate between heat and ice for comfort.  May continue to use muscle relaxers as previously prescribed.  Given gentle stretches and exercises to help.  Recommend following up with orthopedics as scheduled. Final Clinical Impressions(s) / UC Diagnoses   Final diagnoses:  Sciatica of left side  Acute pain of right knee     Discharge Instructions      Take the prednisone  daily for the next 5 days.    Rest as much as possible and drink plenty of fluids.  Ice for 10-15 minutes every 4-6 hours as needed for pain and swelling. You can also use heat or alternate between heat and ice.   You can continue to take the muscle relaxer as previously prescribed and use the biofreeze.   Follow up with orthopedics as scheduled.       ED Prescriptions     Medication Sig Dispense Auth. Provider   predniSONE (DELTASONE) 20 MG tablet Take 2 tablets (40 mg total) by mouth daily for 5 days. 10 tablet Pearson Forster, NP      PDMP not  reviewed this encounter.   Pearson Forster, NP 11/29/20 [phone]

## 2021-01-19 ENCOUNTER — Telehealth (HOSPITAL_COMMUNITY): Payer: No Payment, Other | Admitting: Psychiatry

## 2021-02-18 ENCOUNTER — Telehealth (HOSPITAL_COMMUNITY): Payer: No Payment, Other | Admitting: Psychiatry

## 2021-02-28 ENCOUNTER — Ambulatory Visit (HOSPITAL_COMMUNITY)
Admission: EM | Admit: 2021-02-28 | Discharge: 2021-02-28 | Disposition: A | Discharge status: Home / Self Care | Payer: Federal, State, Local not specified - PPO | Attending: Emergency Medicine | Admitting: Emergency Medicine

## 2021-02-28 ENCOUNTER — Other Ambulatory Visit: Payer: Self-pay

## 2021-02-28 ENCOUNTER — Encounter (HOSPITAL_COMMUNITY): Payer: Self-pay

## 2021-02-28 DIAGNOSIS — K029 Dental caries, unspecified: Secondary | ICD-10-CM | POA: Diagnosis not present

## 2021-02-28 MED ORDER — AMOXICILLIN-POT CLAVULANATE 875-125 MG PO TABS: 1.0000 | ORAL_TABLET | Freq: Two times a day (BID) | ORAL | 0 refills | Status: DC

## 2021-02-28 MED ORDER — TRAMADOL HCL 50 MG PO TABS: 50.0000 mg | ORAL_TABLET | Freq: Four times a day (QID) | ORAL | 0 refills | Status: DC | PRN

## 2021-02-28 MED ORDER — CHLORHEXIDINE GLUCONATE 0.12 % MT SOLN: 15.0000 mL | Freq: Two times a day (BID) | OROMUCOSAL | 0 refills | Status: DC

## 2021-02-28 NOTE — Discharge Instructions (Addendum)
You may take 500mg  acetaminophen every 4-6 hours or in combination with ibuprofen 400-600mg  every 6-8 hours as needed for pain, inflammation, and fever.   Tramadol is strong pain medication. While taking, do not drink alcohol, drive, or perform any other activities that requires focus while taking these medications.   Please take antibiotics as prescribed and be sure to complete entire course even if you start to feel better to ensure infection does not come back.   Dental Care: Organization         Address                                  Phone                       Notes  [facility] Department of [facility] [place] [phone] Accepts children up to age 67 who are enrolled in [place] or [place]; pregnant women with a Medicaid card; and children who have applied for Medicaid or [place] Health Choice, but were declined, whose parents can pay a reduced fee at time of service.  [facility] Department of [facility]  [address], [place] [phone] Accepts children up to age 47 who are enrolled in [place] or [place]; pregnant women with a Medicaid card; and children who have applied for Medicaid or [place] Health Choice, but were declined, whose parents can pay a reduced fee at time of service.  [facility]  [place] [phone] Patients are seen by appointment only. Walk-ins are not accepted. [place] will see patients 67 years of age and older. Monday - Tuesday (8am-5pm) Most Wednesdays (8:30-5pm) $30 per visit, cash only  [facility]  [address], [facility] [phone] Patients are seen by appointment only. Walk-ins are not accepted. [place] will see patients 61 years of age and older. One Wednesday Evening (Monthly: Volunteer Based).  $30 per visit, cash only  [place]   [phone] for adults; Children under age 40, call Graduate Pediatric Dentistry at [phone]. Children aged 37-14, please call [phone] to request a pediatric application.  Dental services are provided in all areas of dental care including fillings, crowns and bridges, complete and partial dentures, implants, gum treatment, root canals, and extractions. Preventive care is also provided. Treatment is provided to both adults and children. Patients are selected via a lottery and there is often a waiting list.   [facility] [address], [place]  [phone] [email or web address]   [facility] [address] [place], [place] [phone], Ext. [phone] Second and Fourth Thursday of each month, opens at 6:30 AM; Clinic ends at 9 AM.  Patients are seen on a first-come first-served basis, and a limited number are seen during each clinic.   [facility]  [address] [place], [place] [phone]   Eligibility Requirements You must have lived in [place], [place], or [place] counties for at least the last three months.   You cannot be eligible for state or federal sponsored [employer], including [employer], [place], or [employer].   You generally cannot be eligible for healthcare insurance through your employer.    How to apply: Eligibility screenings are held  every Tuesday and Wednesday afternoon from 1:00 pm until 4:00 pm. You do not need an appointment for the interview!  [facility] [address], [place], [place]   [place]  [place]  [phone]

## 2021-02-28 NOTE — ED Provider Notes (Signed)
[facility]    CSN: [id number] Arrival date & time: 02/28/21  1005      History   Chief Complaint Chief Complaint  Patient presents with   Dental Pain   Sinus Problem   Headache    HPI Kristin Rose is a 60 y.o. female.   HPI Kristin Rose is a 60 y.o. female presenting to UC with c/o 3 days of waxing and waning Right upper dental pain for 3 days, aching and throbbing, radiating into Right frontal sinuses and exacerbating her migraines.  Pain is 10/10 at times, mild relief with ibuprofen and Tylenol.  She reports discussing a potential root  canal about 6 months ago with her dentist but has not f/u since then.  Denies fever, n/v/d. No ear pain or dizziness. No vision changes. No difficulty breathing or swallowing.    Past Medical History:  Diagnosis Date   Cancer of left breast ([place]) 01/2004   Treatment with lumpectomy, radiation and in 2008 finished with Tamoxifen.   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   Generalized anxiety disorder 10/15/2019   Vitreous floaters of both eyes 03/04/2019   Dental decay 03/04/2019   Hypercholesterolemia 03/04/2019   Tinea pedis of both feet 03/04/2019   Onychomycosis 03/04/2019   Thyromegaly 03/04/2019   Prediabetes 05/21/2018   Anxiety and depression 05/21/2018   Heart murmur    S/P ORIF (open reduction internal fixation) fracture 11/05/2014   Chest pain of uncertain etiology 78/29/5621   Essential hypertension 01/16/2014   Tobacco abuse 01/16/2014   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      Home Medications    Prior to Admission medications   Medication Sig Start Date End Date Taking? Authorizing Provider  amoxicillin-clavulanate (AUGMENTIN) 875-125 MG tablet Take 1 tablet by mouth every 12 (twelve) hours. 02/28/21  Yes Yardley Lekas O, PA-C  chlorhexidine (PERIDEX) 0.12 % solution Use as directed 15 mLs in the mouth or throat 2 (two) times daily. Swish for 30 seconds in mouth after toothbrushing, then spit. Twice daily. 02/28/21  Yes Flor Whitacre O, PA-C  traMADol (ULTRAM) 50 MG tablet Take 1 tablet (50 mg total) by mouth every 6 (six) hours as needed. 02/28/21  Yes Jamela Cumbo O, PA-C  amLODipine (NORVASC) 10 MG tablet Take 1 tablet (10 mg total) by mouth every morning. 01/28/20   Mack Hook, MD  aspirin-acetaminophen-caffeine (EXCEDRIN MIGRAINE) [phone] MG per tablet Take 2 tablets by mouth every 8 (eight) hours as needed for headache.     [provider]  Aspirin-Salicylamide-Caffeine (BC HEADACHE POWDER PO) Take 1 Package by mouth daily as needed (for pain).    [provider]  B Complex-C (SUPER B COMPLEX PO) Take by mouth. 1 daily    [provider]  Biotin 10000 MCG TABS Take by mouth. 1 daily    [provider]  Cholecalciferol (VITAMIN D3) 50 MCG (2000 UT) TABS Take by mouth. 1 daily    [provider]  cyclobenzaprine (FLEXERIL) 10 MG tablet Take 10 mg by mouth 3 (three) times daily. 10/28/20   [provider]  escitalopram (LEXAPRO) 20 MG tablet Take 1 tablet (20 mg total) by mouth daily. 11/18/20   Salley Slaughter,  NP  losartan-hydrochlorothiazide [facility]) 100-25 MG tablet Take 1 tablet by mouth every morning. 01/28/20   Mack Hook, MD  metoprolol tartrate (LOPRESSOR) 100 MG tablet Take 1 tablet (100 mg total) by mouth 2 (two) times daily. 01/28/20   Mack Hook, MD  Misc Natural Products (NEURIVA PO) Take by mouth. 1 daily    [provider]  mometasone (NASONEX) 50 MCG/ACT nasal spray 2 SPRAYS IN EACH NOSTRIL ONCE DAILY 07/09/19   Mack Hook, MD  Olopatadine HCl (PAZEO) 0.7 % SOLN Apply 1 drop to eye daily. 1 drop in each eye daily 08/02/18   Mack Hook, MD  omeprazole (PRILOSEC) 20 MG capsule Take 1 capsule (20 mg total) by mouth daily. 01/28/20   Mack Hook, MD  ondansetron (ZOFRAN-ODT) 8 MG disintegrating tablet Take 1 tablet (8 mg total) by mouth every 8 (eight) hours as needed for nausea. Patient not taking: Reported on 01/28/2020 09/01/19   Robyn Haber, MD  OVER THE COUNTER MEDICATION [employer] sleep Aide. 1 daily at bedtime    [provider]  SUMAtriptan (IMITREX) 50 MG tablet 1 tab by mouth for migraine pain, may repeat in 2 hours if not relieved, max 200 mg in 24 hours. 03/04/19   Mack Hook, MD  vitamin E 1000 UNIT capsule Take 1,000 Units by mouth daily.    [provider]  zinc gluconate 50 MG tablet Take 50 mg by mouth daily.    [provider]    Family History Family History  Problem Relation Age of Onset   Pulmonary embolism Mother    Diabetes type II Mother    Hypertension Mother    Hypertension Father    Glaucoma Father    Lung cancer Father        2005   Cancer Sister 60       Breast   Breast cancer Sister    Diabetes Brother    Schizophrenia Brother    Diabetes Daughter        Obese   Crohn's disease Brother    Diabetes Brother     Social History Social History   Tobacco Use   Smoking status: Every Day    Packs/day: 0.50    Years: 30.00    Pack years: 15.00    Types:  Cigarettes   Smokeless tobacco: Never   Tobacco comments:    Has quit with Chantix before--quit for up to 2 years and then goes back to smoking during stress.  Vaping Use   Vaping Use: Former  Substance Use Topics   Alcohol use: Not Currently    Comment: "used to drink; stopped ~ 2010"   Drug use: No     Allergies   Lisinopril and Percocet [oxycodone-acetaminophen]   Review of Systems Review of Systems  Constitutional:  Negative for chills and fever.  HENT:  Positive for dental problem. Negative for ear discharge, ear pain, tinnitus and trouble swallowing.   Eyes:  Negative for photophobia and visual disturbance.  Musculoskeletal:  Negative for neck pain and neck stiffness.  Neurological:  Negative for dizziness, syncope, weakness, light-headedness and numbness.    Physical Exam Triage Vital Signs ED Triage Vitals  Enc Vitals Group     BP 02/28/21 1028 (!) 155/96     Pulse Rate 02/28/21 1028 61     Resp 02/28/21 1028 18     Temp 02/28/21 1028 98.2 F (36.8 C)     Temp Source 02/28/21 1028 Oral     SpO2 02/28/21 1028 96 %     Weight --      Height --      Head Circumference --      Peak Flow --      Pain Score 02/28/21 1026 10     Pain Loc --      Pain Edu? --      Excl. in [place]? --    No data found.  Updated Vital Signs BP (!) 155/96 (BP Location: Right Arm)   Pulse 61   Temp 98.2 F (36.8 C) (Oral)   Resp 18   LMP  (LMP Unknown)   SpO2 96%   Visual Acuity Right Eye Distance:   Left Eye Distance:   Bilateral Distance:    Right Eye Near:   Left Eye Near:    Bilateral Near:     Physical Exam Vitals and nursing note reviewed.  Constitutional:      General: She is not in acute distress.    Appearance: She is well-developed. She is not ill-appearing, toxic-appearing or diaphoretic.  HENT:     Head: Normocephalic and atraumatic.     Right Ear: Tympanic membrane and ear canal normal. There is impacted cerumen (Partially).     Left Ear: Tympanic  membrane and ear canal normal.     Nose:     Right Sinus: Maxillary sinus tenderness present. No frontal sinus tenderness.     Left Sinus: No maxillary sinus tenderness or frontal sinus tenderness.     Mouth/Throat:     Lips: Pink.     Mouth: Mucous membranes are moist.     Dentition: Abnormal dentition. Dental tenderness, gingival swelling and dental caries present. No dental abscesses or gum lesions.     Pharynx: Oropharynx is clear. Uvula midline. No pharyngeal swelling, oropharyngeal exudate, posterior oropharyngeal erythema or uvula swelling.   Eyes:     Extraocular Movements: Extraocular movements intact.     Pupils: Pupils are equal, round, and reactive to light.  Cardiovascular:     Rate and Rhythm: Normal rate and regular rhythm.  Pulmonary:     Effort: Pulmonary effort is normal. No respiratory distress.  Musculoskeletal:        General: Normal range of motion.     Cervical back: Normal range of motion and neck supple. No rigidity.  Lymphadenopathy:     Cervical: Cervical adenopathy (Right submandibular) present.  Skin:    General: Skin is warm and dry.  Neurological:     Mental Status: She is alert and oriented to person, place, and time.     GCS: GCS eye subscore is 4. GCS verbal subscore is 5. GCS motor subscore is 6.  Psychiatric:        Behavior: Behavior normal.     UC Treatments / Results  Labs (  all labs ordered are listed, but only abnormal results are displayed) Labs Reviewed - No data to display  EKG   Radiology No results found.  Procedures Procedures (including critical care time)  Medications Ordered in UC Medications - No data to display  Initial Impression / Assessment and Plan / UC Course  I have reviewed the triage vital signs and the nursing notes.  Pertinent labs & imaging results that were available during my care of the patient were reviewed by me and considered in my medical decision making (see chart for details).     Hx and  exam c/w early dental abscess Will tx with antibiotics and small amount (10 tabs) of tramadol. Encouraged to call dentist tomorrow to schedule f/u appointment for definitive care. AVS given  Final Clinical Impressions(s) / UC Diagnoses   Final diagnoses:  Pain due to dental caries  Dental decay     Discharge Instructions       You may take 500mg  acetaminophen every 4-6 hours or in combination with ibuprofen 400-600mg  every 6-8 hours as needed for pain, inflammation, and fever.   Tramadol is strong pain medication. While taking, do not drink alcohol, drive, or perform any other activities that requires focus while taking these medications.   Please take antibiotics as prescribed and be sure to complete entire course even if you start to feel better to ensure infection does not come back.   Dental Care: Organization         Address                                  Phone                       Notes  [facility] Department of [facility] [place] [phone] Accepts children up to age 63 who are enrolled in [place] or [place]; pregnant women with a Medicaid card; and children who have applied for Medicaid or [place] Health Choice, but were declined, whose parents can pay a reduced fee at time of service.  [facility] Department of [facility]  [address], [place] [phone] Accepts children up to age 32 who are enrolled in [place] or [place]; pregnant women with a Medicaid card; and children who have applied for Medicaid or [place] Health Choice, but were declined, whose parents can pay a reduced fee at time of service.  [facility]  [place] [phone] Patients are seen by appointment only. Walk-ins are not accepted. [place] will see patients 45 years of age and older. Monday - Tuesday (8am-5pm) Most Wednesdays (8:30-5pm) $30  per visit, cash only  [facility]  [address], [facility] [phone] Patients are seen by appointment only. Walk-ins are not accepted. [place] will see patients 46 years of age and older. One Wednesday Evening (Monthly: Volunteer Based).  $30 per visit, cash only  [place]  [phone] for adults; Children under age 68, call Graduate Pediatric Dentistry at [phone]. Children aged 13-14, please call [phone] to request a pediatric application.  Dental services are provided in all areas of dental care including fillings, crowns and bridges, complete and partial dentures, implants, gum treatment, root canals, and extractions. Preventive care  is also provided. Treatment is provided to both adults and children. Patients are selected via a lottery and there is often a waiting list.   [facility] [address], [place]  [phone] [email or web address]   [facility] [address] [place], [place] [phone], Ext. [phone] Second and Fourth Thursday of each month, opens at 6:30 AM; Clinic ends at 9 AM.  Patients are seen on a first-come first-served basis, and a limited number are seen during each clinic.   [facility]  [address] [place], [place] [phone]   Eligibility Requirements You must have lived in [place], [place], or [place] counties for at least the last three months.   You cannot be eligible for state or federal sponsored [employer], including [employer], [place], or [employer].   You generally cannot be eligible for healthcare insurance through your employer.    How to apply: Eligibility screenings are held every Tuesday and Wednesday afternoon from 1:00 pm until 4:00 pm. You do not need an appointment for the interview!  [facility] [address], [place], [place]   [place]  [place] Department  [place]  [phone]         ED Prescriptions     Medication Sig Dispense Auth. Provider   traMADol (ULTRAM) 50 MG tablet Take 1 tablet (50 mg total) by mouth every 6 (six) hours as needed. 10 tablet Noe Gens, PA-C   amoxicillin-clavulanate (AUGMENTIN) 875-125 MG tablet Take 1 tablet by mouth every 12 (twelve) hours. 14 tablet Gerarda Fraction, Sanayah Munro O, PA-C   chlorhexidine (PERIDEX) 0.12 % solution Use as directed 15 mLs in the mouth or throat 2 (two) times daily. Swish for 30 seconds in mouth after toothbrushing, then spit. Twice daily. 120 mL Noe Gens, [place]      I have reviewed the PDMP during this encounter.   Noe Gens, PA-C 02/28/21 1056

## 2021-02-28 NOTE — ED Triage Notes (Signed)
Pt reports sinus pressure, headache and dental pain x 3 days. Tylenol gives some relief.

## 2021-05-26 ENCOUNTER — Other Ambulatory Visit: Payer: Self-pay | Admitting: Nurse Practitioner

## 2021-05-26 DIAGNOSIS — Z1231 Encounter for screening mammogram for malignant neoplasm of breast: Secondary | ICD-10-CM

## 2021-06-10 ENCOUNTER — Ambulatory Visit
Admission: RE | Admit: 2021-06-10 | Discharge: 2021-06-10 | Disposition: A | Discharge status: Home / Self Care | Payer: Federal, State, Local not specified - PPO | Source: Ambulatory Visit | Attending: Nurse Practitioner | Admitting: Nurse Practitioner

## 2021-06-10 DIAGNOSIS — Z1231 Encounter for screening mammogram for malignant neoplasm of breast: Secondary | ICD-10-CM

## 2021-09-17 ENCOUNTER — Encounter: Payer: Self-pay | Admitting: Cardiology

## 2021-09-17 ENCOUNTER — Ambulatory Visit: Payer: Federal, State, Local not specified - PPO | Admitting: Cardiology

## 2021-09-17 VITALS — BP 141/91 | HR 74 | Temp 98.4°F | Resp 17 | Ht 64.0 in | Wt 200.0 lb

## 2021-09-17 DIAGNOSIS — R9431 Abnormal electrocardiogram [ECG] [EKG]: Secondary | ICD-10-CM

## 2021-09-17 DIAGNOSIS — I700 Atherosclerosis of aorta: Secondary | ICD-10-CM

## 2021-09-17 DIAGNOSIS — I10 Essential (primary) hypertension: Secondary | ICD-10-CM

## 2021-09-17 DIAGNOSIS — E7800 Pure hypercholesterolemia, unspecified: Secondary | ICD-10-CM

## 2021-09-17 DIAGNOSIS — F17218 Nicotine dependence, cigarettes, with other nicotine-induced disorders: Secondary | ICD-10-CM

## 2021-09-17 MED ORDER — NICOTINE 14 MG/24HR TD PT24: 14.0000 mg | MEDICATED_PATCH | Freq: Every day | TRANSDERMAL | 0 refills | Status: AC

## 2021-09-17 MED ORDER — NICOTINE 21 MG/24HR TD PT24: 21.0000 mg | MEDICATED_PATCH | Freq: Every day | TRANSDERMAL | 0 refills | Status: AC

## 2021-09-17 MED ORDER — ASPIRIN 81 MG PO CHEW: 81.0000 mg | CHEWABLE_TABLET | Freq: Every day | ORAL | Status: DC

## 2021-09-17 NOTE — Progress Notes (Signed)
? ?Primary Physician/Referring:  Simona Huh, NP ? ?Patient ID: Kristin Rose, female    DOB: 05/21/1960, 61 y.o.   MRN: [id number] ? ?Chief Complaint  ?Patient presents with  ? New Patient (Initial Visit)  ? CALCIFICATION OF AORTA  ? ?HPI:   ? ?Kristin Rose  is a 61 y.o. African-American female patient with hypertension, mild hyperlipidemia, chronic tobacco use disorder, hyperglycemia, underwent low-dose CT scan of the chest for lung cancer screening revealing aortic atherosclerosis.  She is referred to me for evaluation of abnormal CT scan and abnormal EKG. ? ?States that she is asymptomatic and works in the [employer] without any chest pain or dyspnea but does admit that her physical activity is essentially limited to work place.  She smokes about 1/2 pack to 3/4 pack of cigarettes every day. ? ?Past Medical History:  ?Diagnosis Date  ? 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 Treatment with lumpectomy, radiation and in 2008 finished with Tamoxifen.  ? 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 Ductal carcinoma in situ status post lumpectomy 02/09/2004 status post radiation therapy completed 05/05/2004 Finished treatment in 2008 with Tamoxifen.  ? GERD (gastroesophageal reflux disease)   ? Heart murmur   ? as a child  ? Migraine   ? "weekly" (11/06/2014)  ? Personal history of radiation therapy   ? ?Past Surgical History:  ?Procedure Laterality Date  ? BREAST BIOPSY Left 01/2004  ? BREAST LUMPECTOMY Left 01/2004  ? COLONOSCOPY    ? DILATION AND CURETTAGE OF UTERUS  1990's  ? FRACTURE SURGERY  2016  ? Left ankle ORIf  ? ORIF ANKLE FRACTURE Left 11/05/2014  ? Procedure: OPEN REDUCTION INTERNAL FIXATION (ORIF) LEFT BIMALLEOLAR ANKLE FRACTURE, SYNDESMOSIS;  Surgeon: Leandrew Koyanagi, MD;  Location: [facility];  Service: Orthopedics;  Laterality: Left;  ? TUBAL LIGATION  1990's  ? ?Family History  ?Problem Relation Age of Onset  ? Pulmonary embolism Mother   ? Diabetes type II Mother   ? Hypertension Mother   ?  Hypertension Father   ? Glaucoma Father   ? Lung cancer Father   ?     2005  ? Cancer Sister 51  ?     Breast  ? Breast cancer Sister   ? Diabetes Brother   ? Schizophrenia Brother   ? Diabetes Daughter   ?     Obese  ? Crohn's disease Brother   ? Diabetes Brother   ?  ?Social History  ? ?Tobacco Use  ? Smoking status: Every Day  ?  Packs/day: 0.50  ?  Years: 30.00  ?  Pack years: 15.00  ?  Types: Cigarettes  ? Smokeless tobacco: Never  ? Tobacco comments:  ?  Has quit with Chantix before--quit for up to 2 years and then goes back to smoking during stress.  ?Substance Use Topics  ? Alcohol use: Not Currently  ?  Comment: "used to drink; stopped ~ 2010"  ? ?Marital Status: Married  ?ROS  ?Review of Systems  ?Cardiovascular:  Negative for chest pain, dyspnea on exertion and leg swelling.  ?Objective  ?Blood pressure (!) 141/91, pulse 74, temperature 98.4 ?F (36.9 ?C), temperature source Temporal, resp. rate 17, height 5' 4"  (1.626 m), weight 200 lb (90.7 kg), SpO2 97 %. Body mass index is 34.33 kg/m?.  ? ?  09/17/2021  ?  9:18 AM 09/17/2021  ?  9:09 AM 02/28/2021  ? 10:28 AM  ?Vitals with BMI  ?  Height  5' 4"    ?Weight  200 lbs   ?BMI  34.31   ?Systolic 025 427 062  ?Diastolic 91 87 96  ?Pulse 74 77 61  ?  ?Physical Exam ?Neck:  ?   Vascular: No JVD.  ?Cardiovascular:  ?   Rate and Rhythm: Normal rate and regular rhythm.  ?   Pulses: Intact distal pulses.  ?   Heart sounds: Normal heart sounds. No murmur heard. ?  No gallop.  ?Pulmonary:  ?   Effort: Pulmonary effort is normal.  ?   Breath sounds: Normal breath sounds.  ?Abdominal:  ?   General: Bowel sounds are normal.  ?   Palpations: Abdomen is soft.  ?Musculoskeletal:  ?   Right lower leg: No edema.  ?   Left lower leg: No edema.  ? ? ?Medications and allergies  ? ?Allergies  ?Allergen Reactions  ? Lisinopril Cough  ? Percocet [Oxycodone-Acetaminophen] Itching  ?  ? ?Medication list after today's encounter  ? ?Current Outpatient Medications:  ?  amLODipine (NORVASC)  10 MG tablet, Take 1 tablet (10 mg total) by mouth every morning., Disp: 30 tablet, Rfl: 11 ?  aspirin-acetaminophen-caffeine (EXCEDRIN MIGRAINE) 376-283-15 MG per tablet, Take 2 tablets by mouth every 8 (eight) hours as needed for headache. , Disp: , Rfl:  ?  Aspirin-Salicylamide-Caffeine (BC HEADACHE POWDER PO), Take 1 Package by mouth daily. For headache, Disp: , Rfl:  ?  B Complex-C (SUPER B COMPLEX PO), Take by mouth. 1 daily, Disp: , Rfl:  ?  Biotin 10000 MCG TABS, Take by mouth. 1 daily, Disp: , Rfl:  ?  Cholecalciferol (VITAMIN D3) 50 MCG (2000 UT) TABS, Take by mouth. 1 daily, Disp: , Rfl:  ?  escitalopram (LEXAPRO) 20 MG tablet, Take 1 tablet (20 mg total) by mouth daily., Disp: 30 tablet, Rfl: 2 ?  losartan-hydrochlorothiazide (HYZAAR) 100-25 MG tablet, Take 1 tablet by mouth every morning., Disp: 30 tablet, Rfl: 11 ?  metoprolol tartrate (LOPRESSOR) 100 MG tablet, Take 1 tablet (100 mg total) by mouth 2 (two) times daily., Disp: 60 tablet, Rfl: 11 ?  mometasone (NASONEX) 50 MCG/ACT nasal spray, 2 SPRAYS IN EACH NOSTRIL ONCE DAILY, Disp: 17 g, Rfl: 0 ?  nicotine (NICODERM CQ) 14 mg/24hr patch, Place 1 patch (14 mg total) onto the skin daily. Start after 21 mg dose done, Disp: 28 patch, Rfl: 0 ?  nicotine (NICODERM CQ) 21 mg/24hr patch, Place 1 patch (21 mg total) onto the skin daily for 28 days., Disp: 28 patch, Rfl: 0 ?  Olopatadine HCl (PAZEO) 0.7 % SOLN, Apply 1 drop to eye daily. 1 drop in each eye daily, Disp: 1 Bottle, Rfl: 11 ?  omeprazole (PRILOSEC) 40 MG capsule, Take 40 mg by mouth daily., Disp: , Rfl:  ?  OVER THE COUNTER MEDICATION, [employer] sleep Aide. 1 daily at bedtime, Disp: , Rfl:  ?  rosuvastatin (CRESTOR) 10 MG tablet, Take 1 tablet by mouth daily., Disp: , Rfl:  ?  SUMAtriptan (IMITREX) 50 MG tablet, 1 tab by mouth for migraine pain, may repeat in 2 hours if not relieved, max 200 mg in 24 hours., Disp: 9 tablet, Rfl: 6 ?  vitamin E 1000 UNIT capsule, Take 1,000 Units by mouth  daily., Disp: , Rfl:  ?  zinc gluconate 50 MG tablet, Take 50 mg by mouth daily., Disp: , Rfl:  ?  zolpidem (AMBIEN) 10 MG tablet, Take 10 mg by mouth at bedtime as needed., Disp: ,  Rfl:  ? ?Laboratory examination:  ? ?External labs:  ? ?Labs 08/28/2021: ? ?Total cholesterol 189, triglycerides 91, HDL 47, LDL 124. ? ?Sodium 139, potassium 3.4, BUN 14, creatinine 0.7, EGFR 82 mL, serum glucose 99. ? ?Hb 12.7/HCT 39.1, platelets 351, normal indicis. ? ?A1c 5.0%, TSH normal at 7.09. ? ?Radiology:  ? ?Low-dose CT scan of the chest 08/24/2021: ?No active airspace disease or suspicious pulmonary nodule.  SP partial left mastectomy. ?Calcified normal caliber thoracic aorta. ? ?Cardiac Studies:  ? ? ?EKG:  ? ?EKG 09/17/2021: Normal sinus rhythm at rate of 71 bpm, normal axis, T wave abnormality, anterolateral ischemia.  Inferior ischemia.  Compared to 08/29/2021, T wave abnormality in the anterolateral leads much more pronounced. ? ?Assessment  ? ?  ICD-10-CM   ?1. Aortic atherosclerosis (HCC)  I70.0 PCV ECHOCARDIOGRAM COMPLETE  ?  PCV MYOCARDIAL PERFUSION WO LEXISCAN  ?  DISCONTINUED: aspirin (ASPIRIN CHILDRENS) 81 MG chewable tablet  ?  ?2. Nonspecific abnormal electrocardiogram (ECG) (EKG)  R94.31 PCV ECHOCARDIOGRAM COMPLETE  ?  PCV MYOCARDIAL PERFUSION WO LEXISCAN  ?  ?3. Primary hypertension  I10 EKG 12-Lead  ?  PCV ECHOCARDIOGRAM COMPLETE  ?  ?4. Hypercholesteremia  E78.00   ?  ?5. Abnormal electrocardiogram  R94.31 PCV MYOCARDIAL PERFUSION WO LEXISCAN  ?  ?6. Cigarette nicotine dependence with other nicotine-induced disorder  F17.218 nicotine (NICODERM CQ) 21 mg/24hr patch  ?  nicotine (NICODERM CQ) 14 mg/24hr patch  ?  ?  ? ?Medications Discontinued During This Encounter  ?Medication Reason  ? omeprazole (PRILOSEC) 20 MG capsule Change in therapy  ? amoxicillin-clavulanate (AUGMENTIN) 875-125 MG tablet Completed Course  ? chlorhexidine (PERIDEX) 0.12 % solution   ? Misc Natural Products (NEURIVA PO)   ? traMADol  (ULTRAM) 50 MG tablet   ? cyclobenzaprine (FLEXERIL) 10 MG tablet No longer needed (for PRN medications)  ? ondansetron (ZOFRAN-ODT) 8 MG disintegrating tablet No longer needed (for PRN medications)  ? aspirin

## 2021-09-29 ENCOUNTER — Ambulatory Visit: Payer: Federal, State, Local not specified - PPO

## 2021-09-29 DIAGNOSIS — R9431 Abnormal electrocardiogram [ECG] [EKG]: Secondary | ICD-10-CM

## 2021-09-29 DIAGNOSIS — I700 Atherosclerosis of aorta: Secondary | ICD-10-CM

## 2021-09-29 DIAGNOSIS — I10 Essential (primary) hypertension: Secondary | ICD-10-CM

## 2021-10-06 ENCOUNTER — Ambulatory Visit: Payer: Federal, State, Local not specified - PPO

## 2021-10-06 DIAGNOSIS — R9431 Abnormal electrocardiogram [ECG] [EKG]: Secondary | ICD-10-CM

## 2021-10-06 DIAGNOSIS — I700 Atherosclerosis of aorta: Secondary | ICD-10-CM

## 2021-10-25 ENCOUNTER — Encounter (HOSPITAL_COMMUNITY): Payer: Self-pay | Admitting: *Deleted

## 2021-10-25 ENCOUNTER — Ambulatory Visit (INDEPENDENT_AMBULATORY_CARE_PROVIDER_SITE_OTHER): Payer: Federal, State, Local not specified - PPO

## 2021-10-25 ENCOUNTER — Ambulatory Visit (HOSPITAL_COMMUNITY)
Admission: EM | Admit: 2021-10-25 | Discharge: 2021-10-25 | Disposition: A | Discharge status: Home / Self Care | Payer: Federal, State, Local not specified - PPO | Attending: Internal Medicine | Admitting: Internal Medicine

## 2021-10-25 ENCOUNTER — Other Ambulatory Visit: Payer: Self-pay

## 2021-10-25 DIAGNOSIS — M25511 Pain in right shoulder: Secondary | ICD-10-CM

## 2021-10-25 DIAGNOSIS — S46911A Strain of unspecified muscle, fascia and tendon at shoulder and upper arm level, right arm, initial encounter: Secondary | ICD-10-CM

## 2021-10-25 DIAGNOSIS — R519 Headache, unspecified: Secondary | ICD-10-CM

## 2021-10-25 MED ORDER — METHOCARBAMOL 500 MG PO TABS: 500.0000 mg | ORAL_TABLET | Freq: Two times a day (BID) | ORAL | 0 refills | Status: DC | PRN

## 2021-10-25 MED ORDER — KETOROLAC TROMETHAMINE 30 MG/ML IJ SOLN
30.0000 mg | Freq: Once | INTRAMUSCULAR | Status: AC
  Administered 2021-10-25: 30 mg via INTRAMUSCULAR

## 2021-10-25 MED ORDER — KETOROLAC TROMETHAMINE 30 MG/ML IJ SOLN
INTRAMUSCULAR | Status: AC
  Filled 2021-10-25: qty 1

## 2021-10-25 MED ORDER — NAPROXEN 500 MG PO TABS: 500.0000 mg | ORAL_TABLET | Freq: Two times a day (BID) | ORAL | 0 refills | Status: DC

## 2021-10-25 NOTE — ED Provider Notes (Signed)
[place]    CSN: [id number] Arrival date & time: 10/25/21  1419      History   Chief Complaint Chief Complaint  Patient presents with   Shoulder Pain    HPI Kristin Rose is a 61 y.o. female.   Patient presents urgent care for evaluation of her right shoulder pain after falling out of the bed 1 month ago.  Shoulder pain started 1 week ago and she has been taking Aleve intermittently over the last 2 to 3 days without much pain relief.  Pain is a 10 on a scale of 0-10 at this time and she describes as a sharp shooting pain that is mostly localized to her anterior shoulder.  She states that the pain sometimes radiates to her neck.  No dizziness, numbness or tingling to right arm, wrist pain, or elbow pain.  She applied Biofreeze this morning with some relief of pain.  Pain makes it difficult for patient to get adequate sleep at night and she is unable to find a comfortable position for sleep.  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  She believes that this has exacerbated the right shoulder pain.  She has not taken any medications today other than 2 BC Goody powders in the morning for migraine headache.  Headache is currently a 4 on a scale of 0-10 and generalized.  She reports aura prior to migraine consisting of floaters.  She has not seen a neurologist in "years" for migraines, but was recently started on a migraine prevention medication that has been working slightly.  Denies any other aggravating or relieving factors for shoulder pain at this time.   Shoulder Pain   Past Medical History:  Diagnosis Date   Cancer of left breast ([place]) 01/2004   Treatment with lumpectomy, radiation and in 2008 finished with Tamoxifen.   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   Generalized anxiety disorder 10/15/2019   Vitreous floaters of both eyes 03/04/2019   Dental decay 03/04/2019   Hypercholesterolemia 03/04/2019   Tinea pedis of both feet 03/04/2019   Onychomycosis 03/04/2019   Thyromegaly 03/04/2019   Prediabetes 05/21/2018   Anxiety and depression 05/21/2018   Heart murmur    S/P ORIF (open reduction internal fixation) fracture 11/05/2014   Essential hypertension 01/16/2014   Tobacco abuse 01/16/2014   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      Home Medications    Prior to Admission medications   Medication Sig Start Date End Date Taking? Authorizing Provider  methocarbamol (ROBAXIN) 500 MG tablet Take 1 tablet (500 mg total) by mouth 2 (two) times daily as needed for muscle spasms. 10/25/21  Yes Talbot Grumbling, FNP  naproxen (NAPROSYN) 500 MG tablet Take 1 tablet (500 mg total) by mouth 2 (two) times daily. 10/25/21  Yes Talbot Grumbling, FNP  amLODipine (NORVASC) 10 MG tablet Take 1 tablet (10 mg total) by mouth every morning. 01/28/20   Mack Hook, MD  aspirin-acetaminophen-caffeine  (EXCEDRIN MIGRAINE) [phone] MG per tablet Take 2 tablets by mouth every 8 (eight) hours as needed for headache.     [provider]  Aspirin-Salicylamide-Caffeine (BC HEADACHE POWDER PO) Take 1 Package by mouth daily. For headache    [provider]  B Complex-C (SUPER B COMPLEX PO) Take by mouth. 1 daily    [provider]  Biotin 10000 MCG TABS Take by mouth. 1 daily    [provider]  Cholecalciferol (VITAMIN D3) 50 MCG (2000 UT) TABS Take by mouth. 1 daily    [provider]  escitalopram (LEXAPRO) 20 MG tablet Take 1 tablet (20 mg total) by mouth daily. 11/18/20   Salley Slaughter, NP  losartan-hydrochlorothiazide [facility]) 100-25 MG tablet Take 1 tablet by mouth every morning. 01/28/20   Mack Hook, MD  metoprolol tartrate (LOPRESSOR) 100 MG tablet Take 1 tablet (100 mg total) by mouth 2 (two) times daily. 01/28/20   Mack Hook, MD  mometasone (NASONEX) 50 MCG/ACT nasal spray 2 SPRAYS IN EACH NOSTRIL ONCE DAILY 07/09/19   Mack Hook, MD  nicotine (NICODERM CQ) 14 mg/24hr patch Place 1 patch (14 mg total) onto the skin daily. Start after 21 mg dose done 09/17/21   Adrian Prows, MD  Olopatadine HCl (PAZEO) 0.7 % SOLN Apply 1 drop to eye daily. 1 drop in each eye daily 08/02/18   Mack Hook, MD  omeprazole (PRILOSEC) 40 MG capsule Take 40 mg by mouth daily. 09/14/21   [provider]  OVER THE COUNTER MEDICATION [employer] sleep Aide. 1 daily at bedtime    [provider]  rosuvastatin (CRESTOR) 10 MG tablet Take 1 tablet by mouth daily. 09/06/21   [provider]  SUMAtriptan (IMITREX) 50 MG tablet 1 tab by mouth for migraine pain, may repeat in 2 hours if not relieved, max 200 mg in 24 hours. 03/04/19   Mack Hook, MD  vitamin E 1000 UNIT capsule Take 1,000 Units by mouth daily.    [provider]  zinc gluconate 50 MG tablet Take 50 mg by mouth daily.    [provider]  zolpidem (AMBIEN) 10 MG tablet Take 10 mg by mouth at bedtime as needed. 09/14/21   [provider]    Family History Family History  Problem Relation Age of Onset   Pulmonary embolism Mother    Diabetes type II Mother    Hypertension Mother    Hypertension Father    Glaucoma Father    Lung cancer Father        2005   Cancer Sister 20       Breast   Breast cancer Sister    Diabetes Brother    Schizophrenia Brother    Diabetes Daughter        Obese   Crohn's disease Brother    Diabetes Brother  Social History Social History   Tobacco Use   Smoking status: Every Day    Packs/day: 0.50    Years: 30.00    Total pack years: 15.00    Types: Cigarettes   Smokeless tobacco: Never   Tobacco comments:    Has quit with Chantix before--quit for up to 2 years and then goes back to smoking during stress.  Vaping Use   Vaping Use: Former  Substance Use Topics   Alcohol use: Not Currently    Comment: "used to drink; stopped ~ 2010"   Drug use: No     Allergies   Lisinopril and Percocet [oxycodone-acetaminophen]   Review of Systems Review of Systems Per HPI  Physical Exam Triage Vital Signs ED Triage Vitals  Enc Vitals Group     BP 10/25/21 1553 (!) 157/83     Pulse Rate 10/25/21 1553 62     Resp 10/25/21 1553 18     Temp 10/25/21 1553 99.3 F (37.4 C)     Temp src --      SpO2 10/25/21 1553 98 %     Weight --      Height --      Head Circumference --      Peak Flow --      Pain Score 10/25/21 1549 10     Pain Loc --      Pain Edu? --      Excl. in [place]? --    No data found.  Updated Vital Signs BP (!) 157/83   Pulse 62   Temp 99.3 F (37.4 C)   Resp 18   LMP  (LMP Unknown)   SpO2 98%   Visual Acuity Right Eye Distance:   Left Eye Distance:   Bilateral Distance:    Right Eye Near:   Left Eye Near:    Bilateral Near:     Physical Exam Vitals and nursing note reviewed.  Constitutional:      Appearance: Normal  appearance. She is not ill-appearing or toxic-appearing.     Comments: Very pleasant patient sitting on exam in position of comfort table in no acute distress.   HENT:     Head: Normocephalic and atraumatic.     Right Ear: Hearing, tympanic membrane, ear canal and external ear normal.     Left Ear: Hearing, tympanic membrane, ear canal and external ear normal.     Nose: Nose normal.     Mouth/Throat:     Lips: Pink.     Mouth: Mucous membranes are moist.  Eyes:     General: Lids are normal. Vision grossly intact. Gaze aligned appropriately.     Extraocular Movements: Extraocular movements intact.     Conjunctiva/sclera: Conjunctivae normal.  Cardiovascular:     Rate and Rhythm: Normal rate and regular rhythm.     Heart sounds: Normal heart sounds, S1 normal and S2 normal.  Pulmonary:     Effort: Pulmonary effort is normal. No respiratory distress.     Breath sounds: Normal breath sounds and air entry.  Abdominal:     Palpations: Abdomen is soft.  Musculoskeletal:     Right shoulder: Tenderness present. No deformity, effusion or bony tenderness. Normal range of motion. Normal strength. Normal pulse.     Left shoulder: Normal.       Arms:     Cervical back: Neck supple.     Comments: Pain to anterior right shoulder with palpation.  Negative Hawking sign.  Range of motion is not  limited by tenderness, but patient is tender with active range of motion to the anterior shoulder.  No bruising or swelling visualized.  No crepitus palpated with range of motion to right shoulder.  She has full range of motion of her cervical spine and neck.  No pain to palpation of spine.  Pain with palpation to right supraspinatus muscle.   Skin:    General: Skin is warm and dry.     Capillary Refill: Capillary refill takes less than 2 seconds.     Findings: No rash.  Neurological:     General: No focal deficit present.     Mental Status: She is alert and oriented to person, place, and time. Mental status  is at baseline.     Cranial Nerves: No dysarthria or facial asymmetry.     Gait: Gait is intact.  Psychiatric:        Mood and Affect: Mood normal.        Speech: Speech normal.        Behavior: Behavior normal.        Thought Content: Thought content normal.        Judgment: Judgment normal.      UC Treatments / Results  Labs (all labs ordered are listed, but only abnormal results are displayed) Labs Reviewed - No data to display  EKG   Radiology DG Shoulder Right  Result Date: 10/25/2021 CLINICAL DATA:  Status post fall 1 month ago with persistent right shoulder pain. EXAM: RIGHT SHOULDER - 2+ VIEW COMPARISON:  Sep 29, 2012 FINDINGS: There is no evidence of fracture or dislocation. There is no evidence of arthropathy or other focal bone abnormality. Soft tissues are unremarkable. IMPRESSION: Negative. Electronically Signed   By: Abelardo Diesel M.D.   On: 10/25/2021 17:25    Procedures Procedures (including critical care time)  Medications Ordered in UC Medications  ketorolac (TORADOL) 30 MG/ML injection 30 mg (30 mg Intramuscular Given 10/25/21 1650)    Initial Impression / Assessment and Plan / UC Course  I have reviewed the triage vital signs and the nursing notes.  Pertinent labs & imaging results that were available during my care of the patient were reviewed by me and considered in my medical decision making (see chart for details).  Right shoulder strain Symptomology and physical exam are consistent with strain of musculature of right shoulder and right neck.  X-ray of right shoulder negative for acute bony abnormality.  30 mg ketorolac injection given in clinic for pain management with improvement of symptoms.  Upon reassessment, patient's headache fully resolved and patient's right shoulder pain is a 5 on a scale of 0-10 instead of a 10 on a scale of 0-10 prior to injection.  Patient to take naproxen 500 mg twice daily for the next 3 to 4 days to decrease inflammation  and pain.  No other NSAID containing medications to be taken while taking naproxen including aspirin, Goody powders, Motrin, ibuprofen, etc.  She may take methocarbamol muscle relaxer every 12 hours as needed for muscle spasm to the shoulder/neck.  No drinking alcohol or driving while taking this medication as it may make her sleepy.  Gentle exercises and heat recommended.    Acute Headache Headache improved with ketorolac injection.  Neurologic exam is normal and without focal abnormality.  Neurology walking referral given for further evaluation and work-up related to migraine headaches.   Counseled patient regarding appropriate use of medications and potential side effects for all medications recommended or prescribed  today. Discussed red flag signs and symptoms of worsening condition,when to call the PCP office, return to urgent care, and when to seek higher level of care. Patient verbalizes understanding and agreement with plan. All questions answered. Patient discharged in stable condition.  Final Clinical Impressions(s) / UC Diagnoses   Final diagnoses:  Strain of right shoulder, initial encounter  Acute nonintractable headache, unspecified headache type     Discharge Instructions      You have strained your right shoulder.   Take naproxen twice daily starting tomorrow for the next 3 to 4 days to reduce inflammation and pain to your right shoulder.   Take Robaxin muscle relaxer every 12 hours as needed for muscle spasms.  Do not take this medication and go to work, drink alcohol, or drive as it would likely make you very sleepy.  Apply heat to your right shoulder and neck.  Perform gentle range of motion exercises to reduce muscle stiffness.  Work note is at the end of your packet.  Call the neurologist on the paperwork to schedule an appointment for soon as possible for further evaluation and management of your migraine headaches.     ED Prescriptions     Medication Sig  Dispense Auth. Provider   naproxen (NAPROSYN) 500 MG tablet Take 1 tablet (500 mg total) by mouth 2 (two) times daily. 30 tablet Talbot Grumbling, FNP   methocarbamol (ROBAXIN) 500 MG tablet Take 1 tablet (500 mg total) by mouth 2 (two) times daily as needed for muscle spasms. 20 tablet Talbot Grumbling, FNP      PDMP not reviewed this encounter.   Talbot Grumbling, [place] 10/25/21 [phone]

## 2021-10-25 NOTE — Discharge Instructions (Signed)
You have strained your right shoulder.   Take naproxen twice daily starting tomorrow for the next 3 to 4 days to reduce inflammation and pain to your right shoulder.   Take Robaxin muscle relaxer every 12 hours as needed for muscle spasms.  Do not take this medication and go to work, drink alcohol, or drive as it would likely make you very sleepy.  Apply heat to your right shoulder and neck.  Perform gentle range of motion exercises to reduce muscle stiffness.  Work note is at the end of your packet.  Call the neurologist on the paperwork to schedule an appointment for soon as possible for further evaluation and management of your migraine headaches.

## 2021-10-25 NOTE — ED Triage Notes (Signed)
Pt has Rt shoulder pain. Pt fell out of bed a month ago . Pt reports pain did not start till last week after working her shift. Pain has gotten worse.

## 2021-10-27 ENCOUNTER — Ambulatory Visit: Payer: Federal, State, Local not specified - PPO | Admitting: Cardiology

## 2021-11-02 ENCOUNTER — Ambulatory Visit: Payer: Federal, State, Local not specified - PPO | Admitting: Cardiology

## 2021-11-02 ENCOUNTER — Encounter: Payer: Self-pay | Admitting: Cardiology

## 2021-11-02 VITALS — BP 95/73 | HR 111 | Temp 98.5°F | Resp 17 | Ht 64.0 in | Wt 199.8 lb

## 2021-11-02 DIAGNOSIS — E7800 Pure hypercholesterolemia, unspecified: Secondary | ICD-10-CM

## 2021-11-02 DIAGNOSIS — F17218 Nicotine dependence, cigarettes, with other nicotine-induced disorders: Secondary | ICD-10-CM

## 2021-11-02 DIAGNOSIS — I10 Essential (primary) hypertension: Secondary | ICD-10-CM

## 2021-11-02 DIAGNOSIS — I700 Atherosclerosis of aorta: Secondary | ICD-10-CM

## 2021-11-12 ENCOUNTER — Ambulatory Visit: Payer: Federal, State, Local not specified - PPO | Admitting: Psychiatry

## 2021-12-01 ENCOUNTER — Encounter: Payer: Self-pay | Admitting: Psychiatry

## 2021-12-01 ENCOUNTER — Ambulatory Visit: Payer: Federal, State, Local not specified - PPO | Admitting: Psychiatry

## 2021-12-01 VITALS — BP 136/93 | HR 67 | Ht 63.0 in | Wt 192.0 lb

## 2021-12-01 DIAGNOSIS — R519 Headache, unspecified: Secondary | ICD-10-CM

## 2021-12-01 DIAGNOSIS — G43019 Migraine without aura, intractable, without status migrainosus: Secondary | ICD-10-CM | POA: Diagnosis not present

## 2021-12-01 MED ORDER — SUMATRIPTAN SUCCINATE 100 MG PO TABS: 100.0000 mg | ORAL_TABLET | ORAL | 6 refills | Status: AC | PRN

## 2021-12-01 MED ORDER — LORAZEPAM 0.5 MG PO TABS: ORAL_TABLET | ORAL | 0 refills | Status: AC

## 2021-12-01 MED ORDER — METHYLPREDNISOLONE 4 MG PO TBPK: ORAL_TABLET | ORAL | 0 refills | Status: DC

## 2021-12-01 NOTE — Patient Instructions (Signed)
Plan:  Brain MRI Continue Qulipta 60 mg daily for now. Let me know if headaches do not improve after one month Try to limit over the counter medications to 2 days per week to avoid rebound headaches. Take short course of steroids to help reduce headaches while cutting back on over the counter medications Increase Imitrex to 100 mg as needed
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
## 2021-12-02 ENCOUNTER — Telehealth: Payer: Self-pay | Admitting: Psychiatry

## 2021-12-02 NOTE — Telephone Encounter (Signed)
[employer] sent to GI

## 2021-12-08 ENCOUNTER — Ambulatory Visit
Admission: RE | Admit: 2021-12-08 | Discharge: 2021-12-08 | Disposition: A | Discharge status: Home / Self Care | Payer: Federal, State, Local not specified - PPO | Source: Ambulatory Visit | Attending: Psychiatry | Admitting: Psychiatry

## 2021-12-08 DIAGNOSIS — R519 Headache, unspecified: Secondary | ICD-10-CM | POA: Diagnosis not present

## 2021-12-08 MED ORDER — GADOBENATE DIMEGLUMINE 529 MG/ML IV SOLN
18.0000 mL | Freq: Once | INTRAVENOUS | Status: AC | PRN
  Administered 2021-12-08: 18 mL via INTRAVENOUS

## 2021-12-11 ENCOUNTER — Other Ambulatory Visit: Payer: No Typology Code available for payment source

## 2022-03-01 ENCOUNTER — Telehealth: Payer: Self-pay | Admitting: Cardiology

## 2022-03-01 NOTE — Telephone Encounter (Signed)
Patient is asking for the status of a clearance form that was given to you on 02/25/22.

## 2022-03-07 ENCOUNTER — Encounter: Payer: Self-pay | Admitting: Cardiology

## 2022-05-16 ENCOUNTER — Emergency Department (HOSPITAL_COMMUNITY)
Admission: EM | Admit: 2022-05-16 | Discharge: 2022-05-16 | Disposition: A | Discharge status: Home / Self Care | Payer: Federal, State, Local not specified - PPO | Attending: Emergency Medicine | Admitting: Emergency Medicine

## 2022-05-16 ENCOUNTER — Encounter (HOSPITAL_COMMUNITY): Payer: Self-pay

## 2022-05-16 ENCOUNTER — Other Ambulatory Visit: Payer: Self-pay

## 2022-05-16 ENCOUNTER — Emergency Department (HOSPITAL_COMMUNITY): Payer: Federal, State, Local not specified - PPO

## 2022-05-16 DIAGNOSIS — Z79899 Other long term (current) drug therapy: Secondary | ICD-10-CM | POA: Insufficient documentation

## 2022-05-16 DIAGNOSIS — Z853 Personal history of malignant neoplasm of breast: Secondary | ICD-10-CM | POA: Insufficient documentation

## 2022-05-16 DIAGNOSIS — R0602 Shortness of breath: Secondary | ICD-10-CM | POA: Diagnosis present

## 2022-05-16 DIAGNOSIS — J210 Acute bronchiolitis due to respiratory syncytial virus: Secondary | ICD-10-CM | POA: Diagnosis not present

## 2022-05-16 DIAGNOSIS — Z20822 Contact with and (suspected) exposure to covid-19: Secondary | ICD-10-CM | POA: Diagnosis not present

## 2022-05-16 DIAGNOSIS — I10 Essential (primary) hypertension: Secondary | ICD-10-CM | POA: Insufficient documentation

## 2022-05-16 DIAGNOSIS — J205 Acute bronchitis due to respiratory syncytial virus: Secondary | ICD-10-CM

## 2022-05-16 LAB — CBC
HCT: 37.8 % (ref 36.0–46.0)
Hemoglobin: 12.9 g/dL (ref 12.0–15.0)
MCH: 34.4 pg — ABNORMAL HIGH (ref 26.0–34.0)
MCHC: 34.1 g/dL (ref 30.0–36.0)
MCV: 100.8 fL — ABNORMAL HIGH (ref 80.0–100.0)
Platelets: 298 10*3/uL (ref 150–400)
RBC: 3.75 MIL/uL — ABNORMAL LOW (ref 3.87–5.11)
RDW: 12.1 % (ref 11.5–15.5)
WBC: 5.7 10*3/uL (ref 4.0–10.5)
nRBC: 0 % (ref 0.0–0.2)

## 2022-05-16 LAB — COMPREHENSIVE METABOLIC PANEL
ALT: 22 U/L (ref 0–44)
AST: 32 U/L (ref 15–41)
Albumin: 3.6 g/dL (ref 3.5–5.0)
Alkaline Phosphatase: 147 U/L — ABNORMAL HIGH (ref 38–126)
Anion gap: 10 (ref 5–15)
BUN: 11 mg/dL (ref 8–23)
CO2: 24 mmol/L (ref 22–32)
Calcium: 9.6 mg/dL (ref 8.9–10.3)
Chloride: 102 mmol/L (ref 98–111)
Creatinine, Ser: 0.76 mg/dL (ref 0.44–1.00)
GFR, Estimated: >60 mL/min (ref >60)
Glucose, Bld: 147 mg/dL — ABNORMAL HIGH (ref 70–99)
Potassium: 3.3 mmol/L — ABNORMAL LOW (ref 3.5–5.1)
Sodium: 136 mmol/L (ref 135–145)
Total Bilirubin: 0.6 mg/dL (ref 0.3–1.2)
Total Protein: 7.2 g/dL (ref 6.5–8.1)

## 2022-05-16 LAB — RESP PANEL BY RT-PCR (RSV, FLU A&B, COVID)  RVPGX2
Influenza A by PCR: NEGATIVE
Influenza B by PCR: NEGATIVE
Resp Syncytial Virus by PCR: POSITIVE — AB
SARS Coronavirus 2 by RT PCR: NEGATIVE

## 2022-05-16 LAB — LIPASE, BLOOD: Lipase: 38 U/L (ref 11–51)

## 2022-05-16 LAB — TROPONIN I (HIGH SENSITIVITY)
Troponin I (High Sensitivity): 5 ng/L (ref <18)
Troponin I (High Sensitivity): 4 ng/L (ref <18)

## 2022-05-16 MED ORDER — ALBUTEROL SULFATE (2.5 MG/3ML) 0.083% IN NEBU
5.0000 mg | INHALATION_SOLUTION | Freq: Once | RESPIRATORY_TRACT | Status: AC
  Administered 2022-05-16: 5 mg via RESPIRATORY_TRACT
  Filled 2022-05-16: qty 6

## 2022-05-16 MED ORDER — PREDNISONE 20 MG PO TABS
60.0000 mg | ORAL_TABLET | Freq: Once | ORAL | Status: AC
  Administered 2022-05-16: 60 mg via ORAL
  Filled 2022-05-16 (×2): qty 3

## 2022-05-16 MED ORDER — PREDNISONE 50 MG PO TABS: ORAL_TABLET | ORAL | 0 refills | Status: DC

## 2022-05-16 MED ORDER — IPRATROPIUM BROMIDE 0.02 % IN SOLN
0.5000 mg | Freq: Once | RESPIRATORY_TRACT | Status: AC
  Administered 2022-05-16: 0.5 mg via RESPIRATORY_TRACT
  Filled 2022-05-16: qty 2.5

## 2022-05-16 MED ORDER — ALBUTEROL SULFATE HFA 108 (90 BASE) MCG/ACT IN AERS
2.0000 | INHALATION_SPRAY | Freq: Once | RESPIRATORY_TRACT | Status: AC
  Administered 2022-05-16: 2 via RESPIRATORY_TRACT
  Filled 2022-05-16: qty 6.7

## 2022-05-16 NOTE — ED Notes (Signed)
Ambulated pt, pt stated she felt much better and without any shortness of breath or weakness. Strong and steady gait

## 2022-05-16 NOTE — ED Triage Notes (Signed)
Patient reports mid chest pain with SOB and mild lightheadedness this evening , no emesis or diaphoresis .

## 2022-05-16 NOTE — ED Provider Notes (Signed)
[facility] EMERGENCY DEPARTMENT Provider Note   CSN: [id number] Arrival date & time: 05/16/22  0028     History  Chief Complaint  Patient presents with   Chest Pain    Kristin Rose is a 62 y.o. female.  The history is provided by the patient.  Patient with previous history of breast cancer GERD, hypertension migraines, current tobacco use presents with cough and shortness of breath.  Patient reports about 2 days ago she started having cold symptoms.  She reports grandchildren with similar symptoms.  Over the past day she has had increasing cough and shortness of breath.  She also reports chest tightness and chest pain with cough She reports dry cough.  No hemoptysis.  No recorded fevers, no vomiting She reports increasing dyspnea on exertion   Past Medical History:  Diagnosis Date   Cancer of left breast ([place]) 01/2004   Treatment with lumpectomy, radiation and in 2008 finished with Tamoxifen.   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   GERD (gastroesophageal reflux disease)    Heart murmur    as a child   Migraine    "weekly" (11/06/2014)   Personal history of radiation therapy     Home Medications Prior to Admission medications   Medication Sig Start Date End Date Taking? Authorizing Provider  predniSONE (DELTASONE) 50 MG tablet 1 tablet PO QD X4 days 05/16/22  Yes Ripley Fraise, MD  amLODipine (NORVASC) 10 MG tablet Take 1 tablet (10 mg total) by mouth every morning. 01/28/20   Mack Hook, MD  aspirin-acetaminophen-caffeine (EXCEDRIN MIGRAINE) [phone] MG per tablet Take 2 tablets by mouth every 8 (eight) hours as needed for headache.     [provider]  B Complex-C (SUPER B COMPLEX PO) Take by mouth. 1 daily    [provider]  Biotin 10000 MCG TABS Take by  mouth. 1 daily    [provider]  Cholecalciferol (VITAMIN D3) 50 MCG (2000 UT) TABS Take by mouth. 1 daily    [provider]  escitalopram (LEXAPRO) 20 MG tablet Take 1 tablet (20 mg total) by mouth daily. 11/18/20   Salley Slaughter, NP  LORazepam (ATIVAN) 0.5 MG tablet Take 1-2 pills 30 minutes prior to MRI 12/01/21   Genia Harold, MD  losartan-hydrochlorothiazide [facility]) 100-25 MG tablet Take 1 tablet by mouth every morning. 01/28/20   Mack Hook, MD  methocarbamol (ROBAXIN) 500 MG tablet Take 1 tablet (500 mg total) by mouth 2 (two) times daily as needed for muscle spasms. 10/25/21   Talbot Grumbling, FNP  metoprolol (TOPROL-XL) 200 MG 24 hr tablet Take 200 mg by mouth daily.    [provider]  mometasone (NASONEX) 50 MCG/ACT nasal spray 2 SPRAYS IN EACH NOSTRIL ONCE DAILY 07/09/19   Mack Hook, MD  naproxen (NAPROSYN) 500 MG tablet Take 1 tablet (500 mg total) by mouth 2 (two) times daily. 10/25/21   Talbot Grumbling, FNP  nicotine (NICODERM CQ) 14 mg/24hr patch Place 1 patch (14 mg total) onto the skin daily. Start after 21 mg dose done 09/17/21   Adrian Prows, MD  Olopatadine HCl (PAZEO) 0.7 % SOLN Apply 1 drop to eye daily. 1 drop in each eye daily 08/02/18   Mack Hook, MD  omeprazole (PRILOSEC) 40 MG capsule Take 40 mg by mouth daily. 09/14/21  [provider]  OVER THE COUNTER MEDICATION Nature's Bounty sleep Aide. 1 daily at bedtime    [provider]  QULIPTA 60 MG TABS Take 1 tablet by mouth daily. 10/27/21   [provider]  rosuvastatin (CRESTOR) 10 MG tablet Take 1 tablet by mouth daily. 09/06/21   [provider]  SUMAtriptan (IMITREX) 100 MG tablet Take 1 tablet (100 mg total) by mouth every 2 (two) hours as needed for migraine. May repeat in 2 hours if headache persists or recurs. 12/01/21   Genia Harold, MD  vitamin E 1000 UNIT capsule Take 1,000 Units by mouth daily.    [provider]  zinc gluconate 50 MG tablet Take 50 mg by mouth daily.    [provider]  zolpidem (AMBIEN) 10 MG tablet Take 10 mg by mouth at bedtime as needed. 09/14/21   [provider]      Allergies    Lisinopril and Percocet [oxycodone-acetaminophen]    Review of Systems   Review of Systems  Respiratory:  Positive for cough and shortness of breath.   Cardiovascular:  Negative for leg swelling.  Gastrointestinal:  Negative for vomiting.    Physical Exam Updated Vital Signs BP (!) 152/92   Pulse 96   Temp 98.2 F (36.8 C) (Axillary)   Resp (!) 24   Ht 1.6 m ('5\' 3"'$ )   Wt 87.1 kg   LMP  (LMP Unknown)   SpO2 91%   BMI 34.01 kg/m  Physical Exam CONSTITUTIONAL: Well developed/well nourished, mild distress HEAD: Normocephalic/atraumatic EYES: EOMI/PERRL ENMT: Mucous membranes moist, no stridor, no drooling NECK: supple no meningeal signs SPINE/BACK:entire spine nontender CV: S1/S2 noted, no murmurs/rubs/gallops noted LUNGS: Tachypneic and coarse wheezing bilaterally ABDOMEN: soft, nontender NEURO: Pt is awake/alert/appropriate, moves all extremitiesx4.  No facial droop.   EXTREMITIES: pulses normal/equal, full ROM SKIN: warm, color normal PSYCH: Mildly anxious  ED Results / Procedures / Treatments   Labs (all labs ordered are listed, but only abnormal results are displayed) Labs Reviewed  RESP PANEL BY RT-PCR (RSV, FLU A&B, COVID)  RVPGX2 - Abnormal; Notable for the following components:      Result Value   Resp Syncytial Virus by PCR POSITIVE (*)    All other components within normal limits  CBC - Abnormal; Notable for the following components:   RBC 3.75 (*)    MCV 100.8 (*)    MCH 34.4 (*)    All other components within normal limits  COMPREHENSIVE METABOLIC PANEL - Abnormal; Notable for the following components:   Potassium 3.3 (*)    Glucose, Bld 147 (*)    Alkaline Phosphatase 147 (*)    All other components within normal limits   LIPASE, BLOOD  TROPONIN I (HIGH SENSITIVITY)  TROPONIN I (HIGH SENSITIVITY)    EKG EKG Interpretation  Date/Time:  Monday May 16 2022 00:23:56 EST Ventricular Rate:  79 PR Interval:  164 QRS Duration: 84 QT Interval:  390 QTC Calculation: 447 R Axis:   67 Text Interpretation: Normal sinus rhythm Nonspecific ST and T wave abnormality Abnormal ECG When compared with ECG of 12-Jan-2014 12:17, No significant change was found Confirmed by Delora Fuel ([id number]) on 05/16/2022 2:35:26 AM  Radiology DG Chest 2 View  Result Date: 05/16/2022 CLINICAL DATA:  Chest pain and shortness of breath with cough and congestion EXAM: CHEST - 2 VIEW COMPARISON:  01/12/2014 FINDINGS: Stable cardiomediastinal silhouette. Reticulonodular infiltrates in the bilateral lower lungs. No pleural effusion or pneumothorax. No acute  osseous abnormality. IMPRESSION: Reticulonodular infiltrates in the lower lungs likely infectious/inflammatory. Electronically Signed   By: Placido Sou M.D.   On: 05/16/2022 01:05    Procedures Procedures    Medications Ordered in ED Medications  predniSONE (DELTASONE) tablet 60 mg (60 mg Oral Given 05/16/22 0331)  albuterol (PROVENTIL) (2.5 MG/3ML) 0.083% nebulizer solution 5 mg (5 mg Nebulization Given 05/16/22 0331)  ipratropium (ATROVENT) nebulizer solution 0.5 mg (0.5 mg Nebulization Given 05/16/22 0331)  albuterol (PROVENTIL) (2.5 MG/3ML) 0.083% nebulizer solution 5 mg (5 mg Nebulization Given 05/16/22 0427)  albuterol (VENTOLIN HFA) 108 (90 Base) MCG/ACT inhaler 2 puff (2 puffs Inhalation Given 05/16/22 0546)    ED Course/ Medical Decision Making/ A&P Clinical Course as of 05/16/22 0547  Mon May 16, 2022  0316 Glucose(!): 147 Mild hyperglycemia [DW]  0316 Respiratory Syncytial Virus by PCR(!): POSITIVE Positive for RSV [DW]  6295 Patient with recent cough shortness of breath and now having dyspnea on exertion.  Patient found to have RSV.  This likely triggered her wheezing.   Will treat symptoms and reassess [DW]  0546 After nebulizer treatments and prednisone, patient is feeling improved.  She reports feeling much better with ambulation.  She still has some residual wheeze but much improved.  No crackles.  Suspect patient had RSV with lower respiratory tract infection, complicated by her smoking history.  Since she has responded to bronchodilators and steroids, she is safe for discharge home.  Pulse ox on room air is in the low 90s however her work of breathing is much improved.  She would like to be discharged. [DW]  2841 I counseled her on stopping smoking.  Will provide an albuterol MDI, and steroids for 4 days. [DW]  [phone] We discussed strict return precautions [DW]    Clinical Course User Index [DW] Ripley Fraise, MD                           Medical Decision Making Amount and/or Complexity of Data Reviewed Labs:  Decision-making details documented in ED Course.  Risk Prescription drug management.   This patient presents to the ED for concern of shortness of breath, this involves an extensive number of treatment options, and is a complaint that carries with it a high risk of complications and morbidity.  The differential diagnosis includes but is not limited to Acute coronary syndrome, pneumonia, acute pulmonary edema, pneumothorax, acute anemia, pulmonary embolism    Comorbidities that complicate the patient evaluation: Patient's presentation is complicated by their history of hypertension  Social Determinants of Health: Patient's  blood tobacco abuse   increases the complexity of managing their presentation  Additional history obtained: Records reviewed  cardiology notes reviewed  Lab Tests: I Ordered, and personally interpreted labs.  The pertinent results include: Mild hyperglycemia, positive RSV  Imaging Studies ordered: I ordered imaging studies including X-ray chest   I independently visualized and interpreted imaging which showed no signs  of bacterial pneumonia, no CHF I agree with the radiologist interpretation  Cardiac Monitoring: The patient was maintained on a cardiac monitor.  I personally viewed and interpreted the cardiac monitor which showed an underlying rhythm of:  sinus rhythm  Medicines ordered and prescription drug management: I ordered medication including albuterol and Atrovent and steroids for wheezing Reevaluation of the patient after these medicines showed that the patient    improved  Critical Interventions:   prednisone and nebulized therapies   Reevaluation: After the interventions noted above,  I reevaluated the patient and found that they have :improved  Complexity of problems addressed: Patient's presentation is most consistent with  acute presentation with potential threat to life or bodily function  Disposition: After consideration of the diagnostic results and the patient's response to treatment,  I feel that the patent would benefit from discharge   .           Final Clinical Impression(s) / ED Diagnoses Final diagnoses:  RSV bronchitis    Rx / DC Orders ED Discharge Orders          Ordered    predniSONE (DELTASONE) 50 MG tablet        05/16/22 0542              Ripley Fraise, MD 05/16/22 [phone]

## 2022-05-16 NOTE — ED Provider Triage Note (Signed)
Emergency Medicine Provider Triage Evaluation Note  SRINIKA DELONE , a 62 y.o. female  was evaluated in triage.  Pt complains of CP that began 4 hours ago. States pain is inconsistent/intermittent. Occurs primarily when coughing. Sternal non radiating  Hx smoker, HLD, HTN, hyperglycemia, aortic atherosclerosis.   Cough started 2 days ago. No fevers.  States compliance w HTN meds  Review of Systems  Positive: CP Negative: Fever  Physical Exam  BP (!) 168/118 (BP Location: Right Arm)   Pulse 79   Temp 99 F (37.2 C)   Resp (!) 22   LMP  (LMP Unknown)   SpO2 95%  Gen:   Awake, no distress   Resp:  Normal effort  MSK:   Moves extremities without difficulty  Other:    Medical Decision Making  Medically screening exam initiated at 12:46 AM.  Appropriate orders placed.  KELIYAH SPILLMAN was informed that the remainder of the evaluation will be completed by another provider, this initial triage assessment does not replace that evaluation, and the importance of remaining in the ED until their evaluation is complete.  CP workup, PCR covid/flu    Tedd Sias, [place] 05/16/22 [phone]

## 2022-06-07 ENCOUNTER — Ambulatory Visit: Payer: Self-pay | Admitting: Adult Health

## 2022-07-25 ENCOUNTER — Other Ambulatory Visit: Payer: Self-pay | Admitting: Nurse Practitioner

## 2022-07-25 DIAGNOSIS — Z1231 Encounter for screening mammogram for malignant neoplasm of breast: Secondary | ICD-10-CM

## 2022-07-26 IMAGING — MG DIGITAL SCREENING BILAT W/ TOMO W/ CAD
8 series · 8 of 24 positions shown · non-contrast
Comparison: Previous exam(s).

CLINICAL DATA: Screening.

EXAM:
DIGITAL SCREENING BILATERAL MAMMOGRAM WITH TOMO AND CAD

[R CC synth-2D]
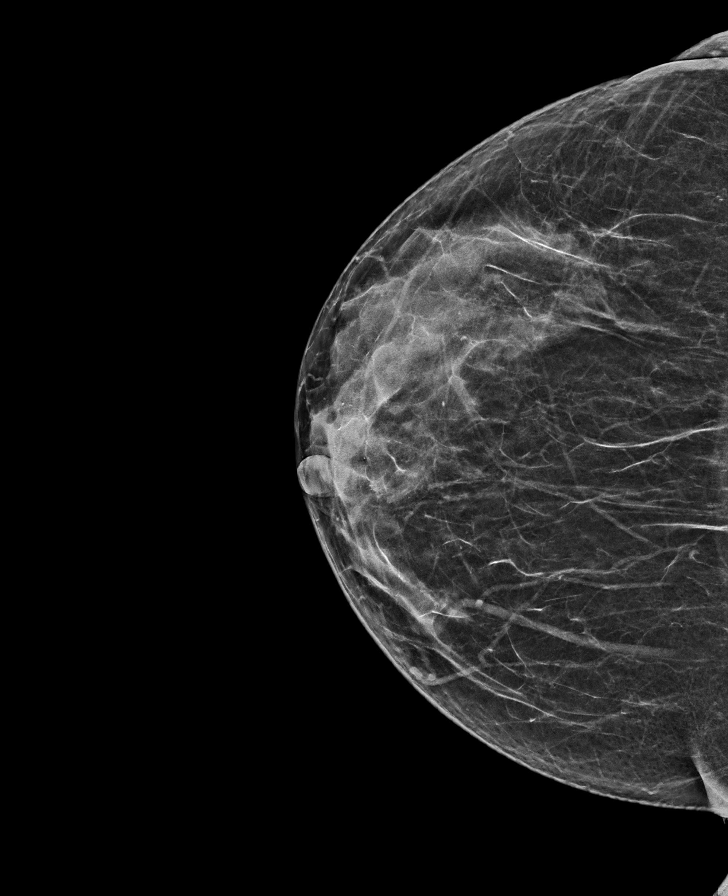

[R MLO synth-2D]
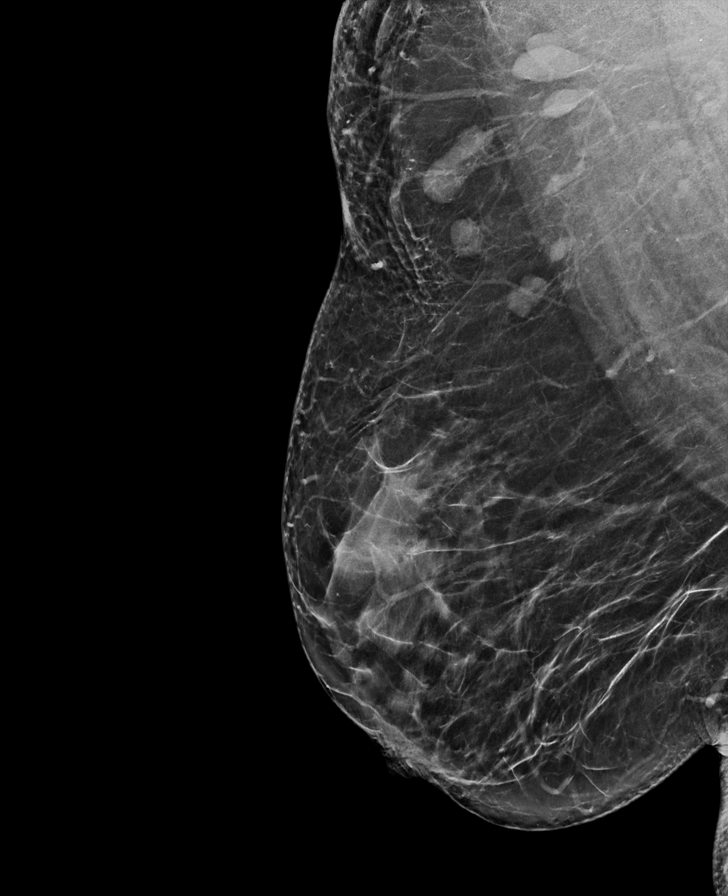

[L MLO synth-2D]
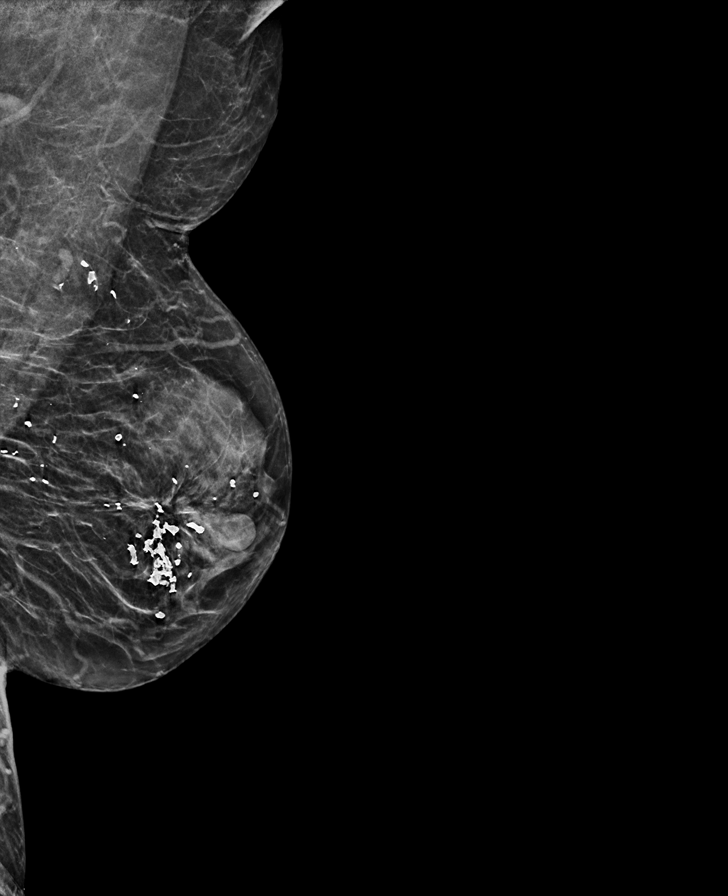

[L CC synth-2D]
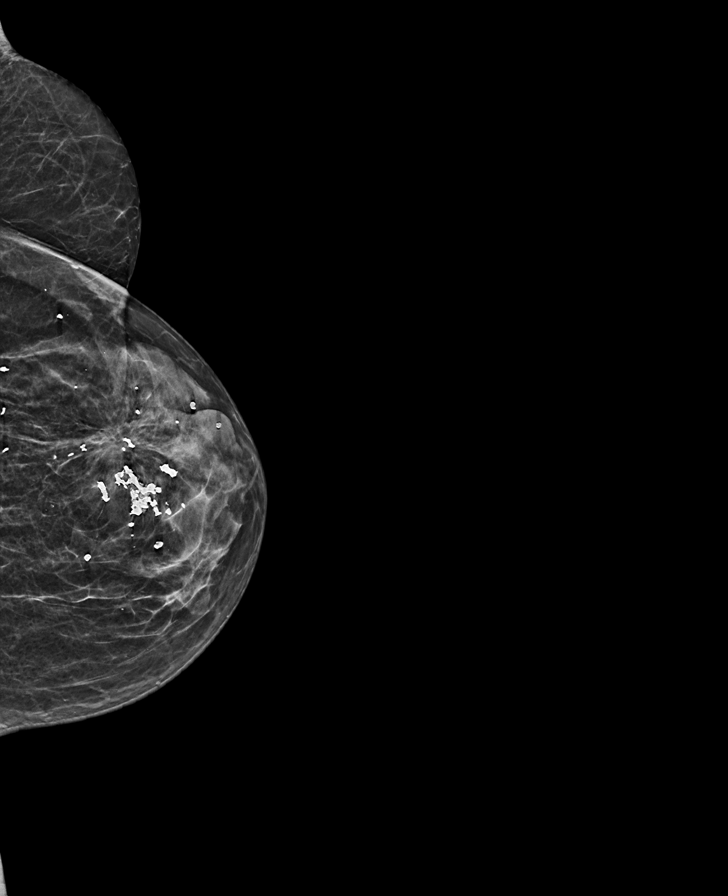

[L MLO tomo · tomo slice 22/43.0]
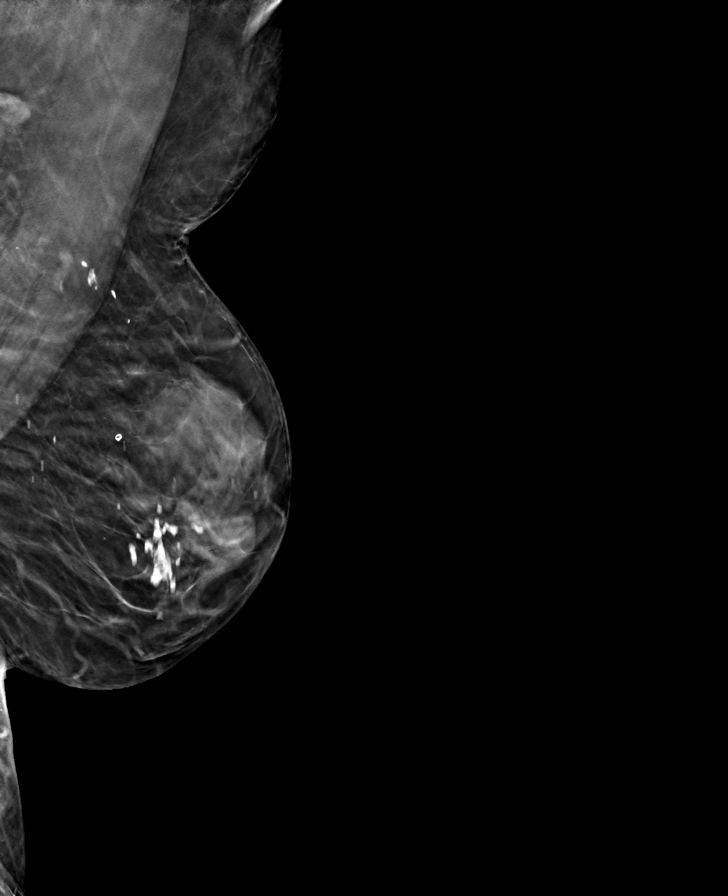

[R MLO tomo · tomo slice 36/71.0]
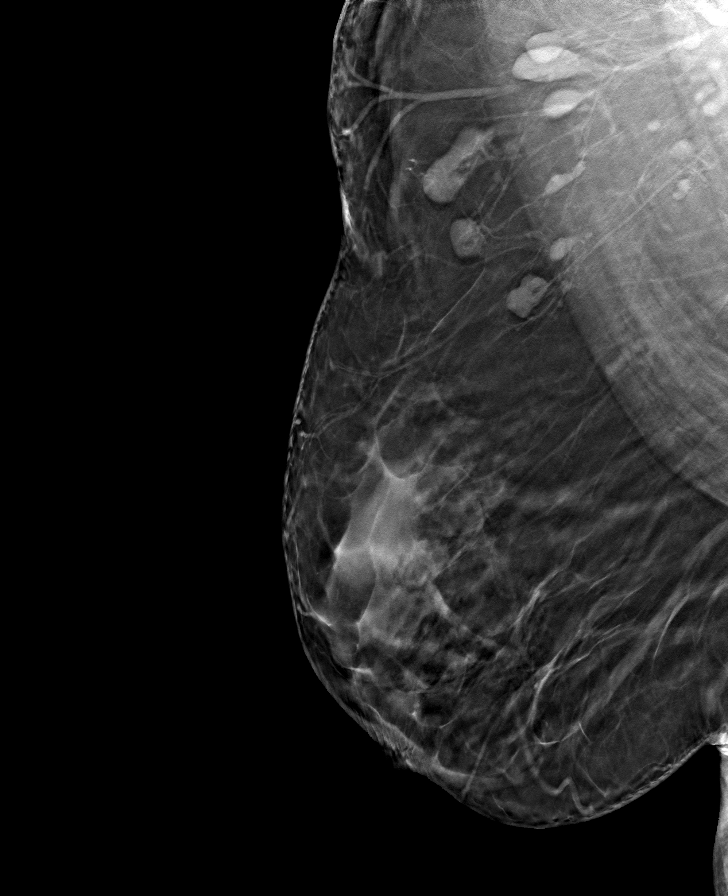

[L CC tomo · tomo slice 21/42.0]
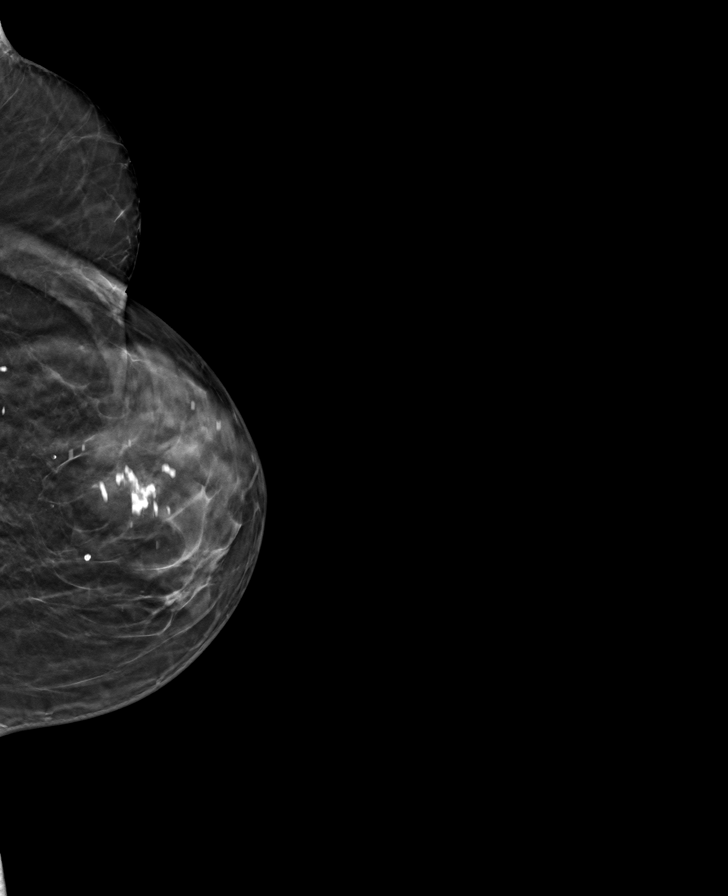

[R CC tomo · tomo slice 29/56.0]
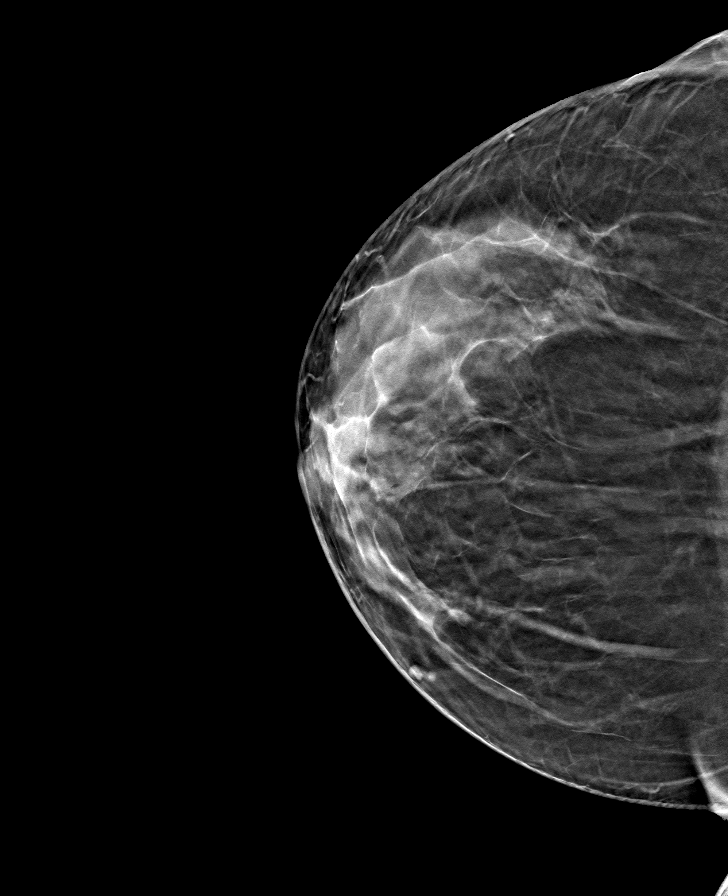

[8 of 24 positions shown; findings below may reference images not displayed]

ACR Breast Density Category c: The breast tissue is heterogeneously
dense, which may obscure small masses.
FINDINGS: There are no findings suspicious for malignancy. Images were
processed with CAD.
IMPRESSION: No mammographic evidence of malignancy. A result letter of this
screening mammogram will be mailed directly to the patient.

RECOMMENDATION:
Screening mammogram in one year. (Code:[id number]-U-LHB)

BI-RADS CATEGORY  1: Negative.

## 2022-08-01 ENCOUNTER — Ambulatory Visit: Payer: 59 | Admitting: Adult Health

## 2022-09-12 ENCOUNTER — Ambulatory Visit: Payer: No Typology Code available for payment source

## 2022-10-17 ENCOUNTER — Ambulatory Visit
Admission: RE | Admit: 2022-10-17 | Discharge: 2022-10-17 | Disposition: A | Discharge status: Home / Self Care | Payer: 59 | Source: Ambulatory Visit | Attending: Nurse Practitioner | Admitting: Nurse Practitioner

## 2022-10-17 DIAGNOSIS — Z1231 Encounter for screening mammogram for malignant neoplasm of breast: Secondary | ICD-10-CM

## 2023-06-20 ENCOUNTER — Other Ambulatory Visit: Payer: Self-pay | Admitting: Family Medicine

## 2023-06-20 DIAGNOSIS — Z122 Encounter for screening for malignant neoplasm of respiratory organs: Secondary | ICD-10-CM

## 2023-06-21 ENCOUNTER — Encounter: Payer: Self-pay | Admitting: Family Medicine

## 2023-07-17 ENCOUNTER — Ambulatory Visit
Admission: RE | Admit: 2023-07-17 | Discharge: 2023-07-17 | Disposition: A | Discharge status: Home / Self Care | Payer: No Typology Code available for payment source | Source: Ambulatory Visit | Attending: Family Medicine

## 2023-07-17 DIAGNOSIS — Z122 Encounter for screening for malignant neoplasm of respiratory organs: Secondary | ICD-10-CM

## 2023-07-30 ENCOUNTER — Encounter (HOSPITAL_COMMUNITY): Payer: Self-pay

## 2023-07-30 ENCOUNTER — Ambulatory Visit (HOSPITAL_COMMUNITY)
Admission: RE | Admit: 2023-07-30 | Discharge: 2023-07-30 | Disposition: A | Discharge status: Home / Self Care | Source: Ambulatory Visit | Attending: Emergency Medicine | Admitting: Emergency Medicine

## 2023-07-30 ENCOUNTER — Ambulatory Visit (INDEPENDENT_AMBULATORY_CARE_PROVIDER_SITE_OTHER)

## 2023-07-30 VITALS — BP 144/88 | HR 69 | Temp 98.5°F | Resp 17 | Ht 64.0 in | Wt 192.0 lb

## 2023-07-30 DIAGNOSIS — M79671 Pain in right foot: Secondary | ICD-10-CM

## 2023-07-30 MED ORDER — NAPROXEN 500 MG PO TABS: 500.0000 mg | ORAL_TABLET | Freq: Two times a day (BID) | ORAL | 0 refills | Status: AC

## 2023-07-30 NOTE — ED Triage Notes (Signed)
 Pt states that she has some right foot pain. X1 month Pt states that her foot is starting to swell. Pt denies any known injury.

## 2023-07-30 NOTE — Discharge Instructions (Addendum)
 The x-ray of your right foot was unremarkable and not concerning for acute bony injury or arthritis.  Physical exam findings are concerning for a condition called plantar fasciitis which is a chronic condition but a manageable condition.  I have enclosed information about plantar fasciitis that I hope you find helpful.  I have also enclosed information about about exercises that you can do at home which may help relieve your pain.    Because you are having significantly worsening pain at this time, I recommend that you wear this cam boot that we provided for you today until you are able to follow-up with your primary care provider to discuss referral to orthopedics.  I provided you with a prescription for naproxen 500 mg tablets are likely to begin taking twice daily.   Finally, I recommend that you ice your foot to 3 times daily by using a frozen water bottle, placed a bottle on the floor and roll your foot over it for about 20 minutes each time.  I also recommend that you do this first in the morning before you get out of bed.  Thank you for visiting [facility] today.  We appreciate the opportunity to participate in your care.

## 2023-07-30 NOTE — ED Provider Notes (Signed)
 [facility]    CSN: [id number] Arrival date & time: 07/30/23  1043    HISTORY   Chief Complaint  Patient presents with   Foot Pain    right ankle pain swelling radiating to foot. - Entered by patient   HPI Kristin Rose is a pleasant, 63 y.o. female who presents to urgent care today. Pt states that she has been having progressively worsening right foot pain for the past month.  Pt states that her right foot is now starting to swell more than her left foot.  Patient states she was unable to work 2 days ago, states she works for [employer] and is on her feet with standing and walking all day.  Pt denies any known acute or prior injury.  Patient states that after getting off work, she goes home, takes off her shoes and the pain gets even worse.  Patient states has not tried anything to alleviate her pain.  Patient has a history of hypertension, currently taking amlodipine, losartan-hydrochlorothiazide and metoprolol for management of her blood pressure.  Patient reports pain with walking but did ambulate independently to the clinic today.  The history is provided by the patient.   Past Medical History:  Diagnosis Date   Breast cancer (HCC)    Cancer of left breast (HCC) 01/2004   Treatment with lumpectomy, radiation and in 2008 finished with Tamoxifen.   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   Generalized anxiety disorder 10/15/2019   Vitreous floaters of both eyes 03/04/2019   Dental decay 03/04/2019    Hypercholesterolemia 03/04/2019   Tinea pedis of both feet 03/04/2019   Onychomycosis 03/04/2019   Thyromegaly 03/04/2019   Prediabetes 05/21/2018   Anxiety and depression 05/21/2018   Heart murmur    S/P ORIF (open reduction internal fixation) fracture 11/05/2014   Essential hypertension 01/16/2014   Tobacco abuse 01/16/2014   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    Home Medications    Prior to Admission medications   Medication Sig Start Date End Date Taking? Authorizing Provider  amLODipine (NORVASC) 10 MG tablet Take 1 tablet (10 mg total) by mouth every morning. 01/28/20  Yes Julieanne Manson, MD  aspirin-acetaminophen-caffeine (EXCEDRIN MIGRAINE) [phone] MG per tablet Take 2 tablets by mouth every 8 (eight) hours as needed for headache.    Yes [provider]  B Complex-C (SUPER B COMPLEX PO) Take by mouth.  1 daily   Yes [provider]  Biotin 16109 MCG TABS Take by mouth. 1 daily   Yes [provider]  Cholecalciferol (VITAMIN D3) 50 MCG (2000 UT) TABS Take by mouth. 1 daily   Yes [provider]  LORazepam (ATIVAN) 0.5 MG tablet Take 1-2 pills 30 minutes prior to MRI 12/01/21  Yes Chima, Victorino Dike, MD  losartan-hydrochlorothiazide [facility]) 100-25 MG tablet Take 1 tablet by mouth every morning. 01/28/20  Yes Julieanne Manson, MD  metoprolol (TOPROL-XL) 200 MG 24 hr tablet Take 200 mg by mouth daily.   Yes  [provider]  mometasone (NASONEX) 50 MCG/ACT nasal spray 2 SPRAYS IN EACH NOSTRIL ONCE DAILY 07/09/19  Yes Julieanne Manson, MD  naproxen (NAPROSYN) 500 MG tablet Take 1 tablet (500 mg total) by mouth 2 (two) times daily. 10/25/21  Yes Carlisle Beers, FNP  nicotine (NICODERM CQ) 14 mg/24hr patch Place 1 patch (14 mg total) onto the skin daily. Start after 21 mg dose done 09/17/21  Yes Yates Decamp, MD  Olopatadine HCl (PAZEO) 0.7 % SOLN Apply 1 drop to eye daily. 1 drop in each eye daily 08/02/18  Yes Julieanne Manson, MD  omeprazole (PRILOSEC) 40 MG capsule Take 40 mg by mouth daily. 09/14/21  Yes [provider]  OVER THE COUNTER MEDICATION [employer] sleep Aide. 1 daily at bedtime   Yes [provider]  QULIPTA 60 MG TABS Take 1 tablet by mouth daily. 10/27/21  Yes [provider]  rosuvastatin (CRESTOR) 10 MG tablet Take 1 tablet by mouth daily. 09/06/21  Yes [provider]  SUMAtriptan (IMITREX) 100 MG tablet Take 1 tablet (100 mg total) by mouth every 2 (two) hours as needed for migraine. May repeat in 2 hours if headache persists or recurs. 12/01/21  Yes Ocie Doyne, MD  vitamin E 1000 UNIT capsule Take 1,000 Units by mouth daily.   Yes [provider]  zinc gluconate 50 MG tablet Take 50 mg by mouth daily.   Yes [provider]  zolpidem (AMBIEN) 10 MG tablet Take 10 mg by mouth at bedtime as needed. 09/14/21  Yes [provider]  escitalopram (LEXAPRO) 20 MG tablet Take 1 tablet (20 mg total) by mouth daily. 11/18/20   Shanna Cisco, NP  methocarbamol (ROBAXIN) 500 MG tablet Take 1 tablet (500 mg total) by mouth 2 (two) times daily as needed for muscle spasms. 10/25/21   Carlisle Beers, FNP  predniSONE (DELTASONE) 50 MG tablet 1 tablet PO QD X4 days 05/16/22   Zadie Rhine, MD    Family History Family History  Problem Relation Age of Onset   Pulmonary embolism Mother    Diabetes type II  Mother    Hypertension Mother    Hypertension Father    Glaucoma Father    Lung cancer Father        2005   Cancer Sister 11       Breast   Breast cancer Sister    Diabetes Brother    Schizophrenia Brother    Diabetes Daughter        Obese   Crohn's disease Brother    Diabetes Brother    Social History Social History   Tobacco Use   Smoking status: Every Day    Current packs/day: 0.50    Average packs/day: 0.5 packs/day for 30.0 years (15.0 ttl pk-yrs)    Types: Cigarettes   Smokeless tobacco: Never   Tobacco comments:    Has quit  with Chantix before--quit for up to 2 years and then goes back to smoking during stress.  Vaping Use   Vaping status: Some Days  Substance Use Topics   Alcohol use: Not Currently    Comment: "used to drink; stopped ~ 2010"   Drug use: No   Allergies   Lisinopril and Percocet [oxycodone-acetaminophen]  Review of Systems Review of Systems Pertinent findings revealed after performing a 14 point review of systems has been noted in the history of present illness.  Physical Exam Vital Signs BP (!) 144/88 (BP Location: Left Arm)   Pulse 69   Temp 98.5 F (36.9 C) (Oral)   Resp 17   Ht 5\' 4"  (1.626 m)   Wt 192 lb (87.1 kg)   LMP  (LMP Unknown)   SpO2 96%   BMI 32.96 kg/m   No data found.  Physical Exam Vitals and nursing note reviewed.  Constitutional:      General: She is not in acute distress.    Appearance: Normal appearance.  HENT:     Head: Normocephalic and atraumatic.  Eyes:     Pupils: Pupils are equal, round, and reactive to light.  Cardiovascular:     Rate and Rhythm: Normal rate and regular rhythm.  Pulmonary:     Effort: Pulmonary effort is normal.     Breath sounds: Normal breath sounds.  Musculoskeletal:        General: Normal range of motion.     Cervical back: Normal range of motion and neck supple.     Right ankle:     Right Achilles Tendon: Tenderness present.     Right foot: Normal range of motion and  normal capillary refill. Swelling and bony tenderness present. No deformity or tenderness. Normal pulse.       Feet:  Skin:    General: Skin is warm and dry.  Neurological:     General: No focal deficit present.     Mental Status: She is alert and oriented to person, place, and time. Mental status is at baseline.  Psychiatric:        Mood and Affect: Mood normal.        Behavior: Behavior normal.        Thought Content: Thought content normal.        Judgment: Judgment normal.     Visual Acuity Right Eye Distance:   Left Eye Distance:   Bilateral Distance:    Right Eye Near:   Left Eye Near:    Bilateral Near:     [facility] Couse / Diagnostics / Procedures:     Radiology DG Foot Complete Right Result Date: 07/30/2023 CLINICAL DATA:  Foot pain and swelling.  No trauma. EXAM: RIGHT FOOT COMPLETE - 3+ VIEW COMPARISON:  None Available. FINDINGS: No acute fracture or dislocation. Mild dorsal soft tissue swelling about the forefoot. IMPRESSION: Soft tissue swelling only. Electronically Signed   By: Jeronimo Greaves M.D.   On: 07/30/2023 12:03    Procedures Procedures (including critical care time) EKG  Pending results:  Labs Reviewed - No data to display  Medications Ordered in UC: Medications - No data to display  UC Diagnoses / Final Clinical Impressions(s)   I have reviewed the triage vital signs and the nursing notes.  Pertinent labs & imaging results that were available during my care of the patient were reviewed by me and considered in my medical decision making (see chart for details).    Final diagnoses:  Right foot pain  Patient advised x-ray of right foot was unremarkable and the physical exam findings concerning for plantar fasciitis.  For this reason, patient placed in a cam boot, advised to begin naproxen 500 mg twice daily and ice but for 20 minutes 4 times daily.  If no improvement, follow-up with orthopedics.  Please see discharge instructions below for details of  plan of care as provided to patient. ED Prescriptions     Medication Sig Dispense Auth. Provider   naproxen (NAPROSYN) 500 MG tablet Take 1 tablet (500 mg total) by mouth 2 (two) times daily. 30 tablet Theadora Rama Scales, PA-C      PDMP not reviewed this encounter.  Pending results:  Labs Reviewed - No data to display    Discharge Instructions      The x-ray of your right foot was unremarkable and not concerning for acute bony injury or arthritis.  Physical exam findings are concerning for a condition called plantar fasciitis which is a chronic condition but a manageable condition.  I have enclosed information about plantar fasciitis that I hope you find helpful.  I have also enclosed information about about exercises that you can do at home which may help relieve your pain.    Because you are having significantly worsening pain at this time, I recommend that you wear this cam boot that we provided for you today until you are able to follow-up with your primary care provider to discuss referral to orthopedics.  I provided you with a prescription for naproxen 500 mg tablets are likely to begin taking twice daily.   Finally, I recommend that you ice your foot to 3 times daily by using a frozen water bottle, placed a bottle on the floor and roll your foot over it for about 20 minutes each time.  I also recommend that you do this first in the morning before you get out of bed.  Thank you for visiting [facility] today.  We appreciate the opportunity to participate in your care.      Disposition Upon Discharge:  Condition: stable for discharge home  Patient presented with an acute illness with associated systemic symptoms and significant discomfort requiring urgent management. In my opinion, this is a condition that a prudent lay person (someone who possesses an average knowledge of health and medicine) may potentially expect to result in complications if not addressed  urgently such as respiratory distress, impairment of bodily function or dysfunction of bodily organs.   Routine symptom specific, illness specific and/or disease specific instructions were discussed with the patient and/or caregiver at length.   As such, the patient has been evaluated and assessed, work-up was performed and treatment was provided in alignment with urgent care protocols and evidence based medicine.  Patient/parent/caregiver has been advised that the patient may require follow up for further testing and treatment if the symptoms continue in spite of treatment, as clinically indicated and appropriate.  Patient/parent/caregiver has been advised to return to the [facility] or PCP if no better; to PCP or the Emergency Department if new signs and symptoms develop, or if the current signs or symptoms continue to change or worsen for further workup, evaluation and treatment as clinically indicated and appropriate  The patient will follow up with their current PCP if and as advised. If the patient does not currently have a PCP we will assist them in obtaining one.   The patient may need specialty follow up if the symptoms continue, in spite of conservative treatment  and management, for further workup, evaluation, consultation and treatment as clinically indicated and appropriate.  Patient/parent/caregiver verbalized understanding and agreement of plan as discussed.  All questions were addressed during visit.  Please see discharge instructions below for further details of plan.  This office note has been dictated using Teaching laboratory technician.  Unfortunately, this method of dictation can sometimes lead to typographical or grammatical errors.  I apologize for your inconvenience in advance if this occurs.  Please do not hesitate to reach out to me if clarification is needed.      Theadora Rama Scales, [place] 07/31/23 [phone]

## 2023-08-28 ENCOUNTER — Other Ambulatory Visit: Payer: Self-pay | Admitting: Family Medicine

## 2023-08-28 ENCOUNTER — Other Ambulatory Visit (HOSPITAL_COMMUNITY)
Admission: RE | Admit: 2023-08-28 | Discharge: 2023-08-28 | Disposition: A | Discharge status: Home / Self Care | Source: Ambulatory Visit | Attending: Family Medicine | Admitting: Family Medicine

## 2023-08-28 DIAGNOSIS — Z01411 Encounter for gynecological examination (general) (routine) with abnormal findings: Secondary | ICD-10-CM | POA: Insufficient documentation

## 2023-08-30 LAB — CYTOLOGY - PAP
Adequacy: ABSENT
Chlamydia: NEGATIVE
Comment: NEGATIVE
Comment: NEGATIVE
Comment: NORMAL
Diagnosis: NEGATIVE
High risk HPV: NEGATIVE
Neisseria Gonorrhea: NEGATIVE

## 2023-11-28 ENCOUNTER — Other Ambulatory Visit: Payer: Self-pay | Admitting: Family Medicine

## 2023-11-28 DIAGNOSIS — Z1231 Encounter for screening mammogram for malignant neoplasm of breast: Secondary | ICD-10-CM

## 2023-12-01 ENCOUNTER — Encounter: Payer: Self-pay | Admitting: Emergency Medicine

## 2023-12-01 ENCOUNTER — Ambulatory Visit: Admitting: Emergency Medicine

## 2023-12-01 VITALS — BP 121/76 | HR 69 | Ht 64.0 in | Wt 215.0 lb

## 2023-12-01 DIAGNOSIS — F17210 Nicotine dependence, cigarettes, uncomplicated: Secondary | ICD-10-CM

## 2023-12-01 DIAGNOSIS — G4733 Obstructive sleep apnea (adult) (pediatric): Secondary | ICD-10-CM | POA: Diagnosis not present

## 2023-12-01 DIAGNOSIS — R0609 Other forms of dyspnea: Secondary | ICD-10-CM | POA: Diagnosis not present

## 2023-12-01 DIAGNOSIS — R918 Other nonspecific abnormal finding of lung field: Secondary | ICD-10-CM

## 2023-12-01 DIAGNOSIS — Z720 Tobacco use: Secondary | ICD-10-CM

## 2023-12-01 NOTE — Assessment & Plan Note (Signed)
 Pulmonary nodules Multiple small nodules on CT, largest 5 mm in right lower lobe. Require surveillance to rule out early lung cancer. - Repeat lung cancer screening CT scan of the chest in April 2026 for surveillance.

## 2023-12-01 NOTE — Progress Notes (Signed)
 Subjective:    Patient ID: Kristin Rose, female    DOB: Jan 09, 1961, 63 y.o.   MRN: [id number]  HPI 63 year old woman with a history of tobacco use (23 pack years), remote breast cancer (DCIS, 2005 with lumpectomy, XRT and tamoxifen), migraines, newly diagnosed OSA on CPAP.   She was referred by Dr. Athena for evaluation of her shortness of breath and pulmonary nodules.  She experiences shortness of breath, particularly during activities such as cleaning and cooking, which she previously could perform without difficulty. These tasks now cause fatigue more quickly, requiring her to stop and rest before continuing. No daily coughing, but she mentions occasional coughing, which she attributes to allergies. No wheezing or pain when breathing, except during a past episode of RSV, which was treated with antibiotics, prednisone , and an albuterol  inhaler. She no longer uses the inhaler.  A lung cancer screening CT scan performed on August 11, 2023, was recommended for repeat in twelve months due to the presence of several small pulmonary nodules. The scan was recommended for repeat in twelve months.  She has a significant history of tobacco use, smoking half a pack per day for approximately 45 years, starting in her teenage years. She has attempted to quit smoking in the past, successfully stopping for two years using a talkative program and Chantix, though she experienced side effects such as vivid dreams and indigestion with Chantix.  She uses Nasonex  nasal spray occasionally for allergic rhinitis and takes omeprazole  for GERD. She is not currently on any bronchodilator therapy.  She has been using a CPAP machine for sleep apnea for about six months, which has improved her sleep quality and reduced snoring.   RADIOLOGY Chest CT: Normal heart and mediastinum, no axillary metastasis or higher adenopathy, mild centrilobular emphysema, diffuse bronchial wall thickening, several scattered small pulmonary  nodules bilaterally, largest 5 mm in the right lower lobe with a major fissure (08/11/2023)   Review of Systems As per HPI  Past Medical History:  Diagnosis Date   Breast cancer (HCC)    Cancer of left breast (HCC) 01/2004   Treatment with lumpectomy, radiation and in 2008 finished with Tamoxifen.   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   GERD (gastroesophageal reflux disease)    Heart murmur    as a child   Migraine    weekly (11/06/2014)   Personal history of radiation therapy     Family History  Problem Relation Age of Onset   Pulmonary embolism Mother    Diabetes type II Mother    Hypertension Mother    Hypertension Father    Glaucoma Father    Lung cancer Father        2005   Cancer Sister 95       Breast   Breast cancer Sister    Diabetes Brother    Schizophrenia Brother    Diabetes Daughter        Obese   Crohn's disease Brother    Diabetes Brother      Social History   Socioeconomic History   Marital status: Married    Spouse name: Kona Yusuf   Number of children: 2   Years of education: 12   Highest education level: High school graduate  Occupational History   Occupation: Forensic scientist work  Tobacco Use   Smoking status: Every Day  Current packs/day: 0.50    Average packs/day: 0.5 packs/day for 30.0 years (15.0 ttl pk-yrs)    Types: Cigarettes   Smokeless tobacco: Never   Tobacco comments:    Has quit with Chantix before--quit for up to 2 years and then goes back to smoking during stress.  Vaping Use   Vaping status: Some Days  Substance and Sexual Activity   Alcohol use: Not Currently    Comment: used to drink; stopped ~ 2010   Drug use: No   Sexual activity: Not Currently    Birth control/protection: Surgical  Other Topics Concern   Not on file  Social History  Narrative   Lives with husband.   Husband disabled   She is doing well now that she is working (2020)   Social Drivers of Corporate investment banker Strain: Low Risk  (05/21/2018)   Overall Financial Resource Strain (CARDIA)    Difficulty of Paying Living Expenses: Not very hard  Food Insecurity: No Food Insecurity (05/21/2018)   Hunger Vital Sign    Worried About Running Out of Food in the Last Year: Never true    Ran Out of Food in the Last Year: Never true  Transportation Needs: No Transportation Needs (05/21/2018)   PRAPARE - Administrator, Civil Service (Medical): No    Lack of Transportation (Non-Medical): No  Physical Activity: Not on file  Stress: Not on file  Social Connections: Unknown (05/21/2018)   Social Connection and Isolation Panel    Frequency of Communication with Friends and Family: Not on file    Frequency of Social Gatherings with Friends and Family: Not on file    Attends Religious Services: Not on file    Active Member of Clubs or Organizations: Not on file    Attends Banker Meetings: Not on file    Marital Status: Married  Intimate Partner Violence: Not At Risk (03/04/2019)   Humiliation, Afraid, Rape, and Kick questionnaire    Fear of Current or Ex-Partner: No    Emotionally Abused: No    Physically Abused: No    Sexually Abused: No   - Tobacco: Current smoker, 0.5 ppd x 45 years (22 pack-years) - Employment: Designer, jewellery, 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 30 tablet 11   aspirin -acetaminophen -caffeine (EXCEDRIN MIGRAINE) 250-250-65 MG per tablet Take 2 tablets by mouth every 8 (eight) hours  as needed for headache.      B Complex-C (SUPER B COMPLEX PO) Take by mouth. 1 daily     Biotin 10000 MCG TABS Take by mouth. 1 daily     Cholecalciferol (VITAMIN D3) 50 MCG (2000 UT) TABS Take by mouth. 1 daily     LORazepam  (ATIVAN ) 0.5 MG tablet Take 1-2 pills 30 minutes prior to MRI 2 tablet 0   losartan -hydrochlorothiazide  (HYZAAR) 100-25 MG tablet Take 1 tablet by mouth every morning. 30 tablet 11   metoprolol  (TOPROL -XL) 200 MG 24 hr tablet Take 200 mg by mouth daily.     mometasone  (NASONEX ) 50 MCG/ACT nasal spray 2 SPRAYS IN EACH NOSTRIL ONCE DAILY 17 g 0   naproxen  (NAPROSYN ) 500 MG tablet Take 1 tablet (500 mg total)  by mouth 2 (two) times daily. 30 tablet 0   nicotine  (NICODERM CQ ) 14 mg/24hr patch Place 1 patch (14 mg total) onto the skin daily. Start after 21 mg dose done 28 patch 0   Olopatadine  HCl (PAZEO) 0.7 % SOLN Apply 1 drop to eye daily. 1 drop in each eye daily 1 Bottle 11   omeprazole  (PRILOSEC) 40 MG capsule Take 40 mg by mouth daily.     OVER THE COUNTER MEDICATION Nature's Bounty sleep Aide. 1 daily at bedtime     QULIPTA 60 MG TABS Take 1 tablet by mouth daily.     rosuvastatin (CRESTOR) 10 MG tablet Take 1 tablet by mouth daily.     SUMAtriptan  (IMITREX ) 100 MG tablet Take 1 tablet (100 mg total) by mouth every 2 (two) hours as needed for migraine. May repeat in 2 hours if headache persists or recurs. 10 tablet 6   vitamin E 1000 UNIT capsule Take 1,000 Units by mouth daily.     zinc gluconate 50 MG tablet Take 50 mg by mouth daily.     zolpidem  (AMBIEN ) 10 MG tablet Take 10 mg by mouth at bedtime as needed.     No current facility-administered medications on file prior to visit.         Objective:   Physical Exam Vitals:   12/01/23 0941  BP: 121/76  Pulse: 69  SpO2: 96%  Weight: 215 lb (97.5 kg)  Height: 5' 4 (1.626 m)   Gen: Pleasant, overweight woman, in no distress,  normal affect  ENT: No lesions,  mouth clear,  oropharynx clear, no postnasal  drip  Neck: No JVD, no stridor  Lungs: No use of accessory muscles, no crackles or wheezing on normal respiration, no wheeze on forced expiration  Cardiovascular: RRR, heart sounds normal, no murmur or gallops, no peripheral edema  Musculoskeletal: No deformities, no cyanosis or clubbing  Neuro: alert, awake, non focal  Skin: Warm, no lesions or rash       Assessment & Plan:   Exertional dyspnea Mild emphysema Mild centrilobular emphysema on CT, likely from long-term tobacco use. Discussed potential benefit of inhaled medications if significant airflow obstruction is present. - Order pulmonary function tests to assess for COPD and airflow obstruction. - Consider inhaled bronchodilator therapy based on test results. - Encourage reduction in cigarette smoking.    Pulmonary nodules Pulmonary nodules Multiple small nodules on CT, largest 5 mm in right lower lobe. Require surveillance to rule out early lung cancer. - Repeat lung cancer screening CT scan of the chest in April 2026 for surveillance.   Tobacco abuse  Tobacco use Chronic use for 45 years, currently up to half a pack per day. Discussed importance of reduction and cessation strategies. - Encourage reduction in cigarette consumption. - Discuss cessation strategies in future visits.  OSA (obstructive sleep apnea) Obstructive sleep apnea Using CPAP for six months with improved sleep quality and reduced snoring. - Continue CPAP as prescribed.   Lamar Chris, MD, PhD 12/01/2023, 10:28 AM [facility] [phone] or if no answer before 7:00PM call [phone] For any issues after 7:00PM please call eLink [phone]

## 2023-12-01 NOTE — Assessment & Plan Note (Signed)
 Mild emphysema Mild centrilobular emphysema on CT, likely from long-term tobacco use. Discussed potential benefit of inhaled medications if significant airflow obstruction is present. - Order pulmonary function tests to assess for COPD and airflow obstruction. - Consider inhaled bronchodilator therapy based on test results. - Encourage reduction in cigarette smoking.

## 2023-12-01 NOTE — Patient Instructions (Addendum)
 Today, you were seen for shortness of breath and pulmonary nodules. We discussed your history of tobacco use, remote breast cancer, and current symptoms. You have been experiencing increased fatigue during activities like cleaning and cooking. We reviewed your recent lung cancer screening CT scan and discussed the need for ongoing surveillance. We also talked about your mild emphysema, tobacco use, and obstructive sleep apnea.  YOUR PLAN:  -PULMONARY NODULES: Pulmonary nodules are small growths in the lungs that need to be monitored to ensure they do not develop into cancer. We will repeat your CT scan in March-April 2026 to keep an eye on these nodules.  -MILD EMPHYSEMA: Emphysema is a lung condition that causes shortness of breath due to damage to the air sacs in the lungs, often from smoking. We will order pulmonary function tests to check for COPD and airflow obstruction, and we may consider inhaled medications based on the results. Reducing cigarette smoking is highly encouraged.  -TOBACCO USE: Long-term tobacco use can lead to various health issues, including lung disease. It is important to reduce and eventually quit smoking. We will discuss strategies to help you quit in future visits.  -OBSTRUCTIVE SLEEP APNEA: Obstructive sleep apnea is a condition where your breathing stops and starts during sleep. You have been using a CPAP machine for six months, which has improved your sleep quality and reduced snoring. Continue using the CPAP as prescribed.  INSTRUCTIONS:  Please schedule a repeat CT scan of your chest for April 2026. We will also arrange for pulmonary function tests to assess for COPD and airflow obstruction. Continue using your CPAP machine as prescribed. We will discuss smoking cessation strategies in future visits.

## 2023-12-01 NOTE — Assessment & Plan Note (Signed)
 Obstructive sleep apnea Using CPAP for six months with improved sleep quality and reduced snoring. - Continue CPAP as prescribed.

## 2023-12-01 NOTE — Assessment & Plan Note (Signed)
  Tobacco use Chronic use for 45 years, currently up to half a pack per day. Discussed importance of reduction and cessation strategies. - Encourage reduction in cigarette consumption. - Discuss cessation strategies in future visits.

## 2023-12-08 ENCOUNTER — Ambulatory Visit
Admission: RE | Admit: 2023-12-08 | Discharge: 2023-12-08 | Disposition: A | Discharge status: Home / Self Care | Source: Ambulatory Visit | Attending: Family Medicine | Admitting: Family Medicine

## 2023-12-08 DIAGNOSIS — Z1231 Encounter for screening mammogram for malignant neoplasm of breast: Secondary | ICD-10-CM

## 2024-01-16 ENCOUNTER — Other Ambulatory Visit: Payer: Self-pay | Admitting: Family Medicine

## 2024-01-16 DIAGNOSIS — Z1239 Encounter for other screening for malignant neoplasm of breast: Secondary | ICD-10-CM

## 2024-01-16 DIAGNOSIS — R9230 Dense breasts, unspecified: Secondary | ICD-10-CM

## 2024-02-11 ENCOUNTER — Ambulatory Visit
Admission: RE | Admit: 2024-02-11 | Discharge: 2024-02-11 | Disposition: A | Source: Ambulatory Visit | Attending: Family Medicine

## 2024-02-11 DIAGNOSIS — R9230 Dense breasts, unspecified: Secondary | ICD-10-CM

## 2024-02-11 DIAGNOSIS — Z1239 Encounter for other screening for malignant neoplasm of breast: Secondary | ICD-10-CM

## 2024-02-11 MED ORDER — GADOPICLENOL 0.5 MMOL/ML IV SOLN
9.0000 mL | Freq: Once | INTRAVENOUS | Status: AC | PRN
  Administered 2024-02-11: 9 mL via INTRAVENOUS

## 2024-02-28 ENCOUNTER — Encounter: Payer: Self-pay | Admitting: Licensed Clinical Social Worker

## 2024-02-28 NOTE — Progress Notes (Signed)
 [facility] Clinical Social Work  Visual merchandiser received TC from pt asking about specialty bras for women post-breast surgery for cancer.  CSW provided information on [place] [employer] [place].  No other needs at this time.      April Carlyon E Gisele Pack, LCSW  Clinical Social Worker [facility]

## 2024-03-06 ENCOUNTER — Encounter

## 2024-03-06 ENCOUNTER — Ambulatory Visit: Admitting: Emergency Medicine

## 2024-05-16 ENCOUNTER — Ambulatory Visit: Admitting: Emergency Medicine

## 2024-05-16 ENCOUNTER — Ambulatory Visit

## 2024-05-16 ENCOUNTER — Encounter: Payer: Self-pay | Admitting: Emergency Medicine

## 2024-05-16 VITALS — BP 130/78 | HR 75

## 2024-05-16 DIAGNOSIS — Z720 Tobacco use: Secondary | ICD-10-CM

## 2024-05-16 DIAGNOSIS — J449 Chronic obstructive pulmonary disease, unspecified: Secondary | ICD-10-CM

## 2024-05-16 DIAGNOSIS — R918 Other nonspecific abnormal finding of lung field: Secondary | ICD-10-CM

## 2024-05-16 DIAGNOSIS — F17210 Nicotine dependence, cigarettes, uncomplicated: Secondary | ICD-10-CM

## 2024-05-16 DIAGNOSIS — G4733 Obstructive sleep apnea (adult) (pediatric): Secondary | ICD-10-CM

## 2024-05-16 LAB — PULMONARY FUNCTION TEST
DL/VA % pred: 92 %
DL/VA: 3.84 ml/min/mmHg/L
DLCO unc % pred: 71 %
DLCO unc: 14.84 ml/min/mmHg
FEF 25-75 Post: 1.53 L/s
FEF 25-75 Pre: 1.83 L/s
FEF2575-%Change-Post: -16 %
FEF2575-%Pred-Post: 67 %
FEF2575-%Pred-Pre: 80 %
FEV1-%Change-Post: -2 %
FEV1-%Pred-Post: 71 %
FEV1-%Pred-Pre: 73 %
FEV1-Post: 1.84 L
FEV1-Pre: 1.88 L
FEV1FVC-%Change-Post: 0 %
FEV1FVC-%Pred-Pre: 101 %
FEV6-%Change-Post: -2 %
FEV6-%Pred-Post: 72 %
FEV6-%Pred-Pre: 73 %
FEV6-Post: 2.32 L
FEV6-Pre: 2.38 L
FEV6FVC-%Change-Post: 0 %
FEV6FVC-%Pred-Post: 103 %
FEV6FVC-%Pred-Pre: 103 %
FVC-%Change-Post: -2 %
FVC-%Pred-Post: 69 %
FVC-%Pred-Pre: 71 %
FVC-Post: 2.33 L
FVC-Pre: 2.4 L
Post FEV1/FVC ratio: 79 %
Post FEV6/FVC ratio: 100 %
Pre FEV1/FVC ratio: 79 %
Pre FEV6/FVC Ratio: 99 %
RV % pred: 42 %
RV: 0.89 L
TLC % pred: 62 %
TLC: 3.28 L

## 2024-05-16 MED ORDER — ALBUTEROL SULFATE HFA 108 (90 BASE) MCG/ACT IN AERS: 2.0000 | INHALATION_SPRAY | Freq: Four times a day (QID) | RESPIRATORY_TRACT | 6 refills | Status: AC | PRN

## 2024-05-16 MED ORDER — STIOLTO RESPIMAT 2.5-2.5 MCG/ACT IN AERS: INHALATION_SPRAY | RESPIRATORY_TRACT | Status: AC

## 2024-05-16 NOTE — Patient Instructions (Signed)
 Full pft performed today

## 2024-05-16 NOTE — Patient Instructions (Signed)
 We reviewed your pulmonary function testing today.  There is some evidence for mild COPD present. We will try using a new medication called Spiriva.  Take 2 puffs once daily.  We will try this for several weeks to see if it helps your breathing.  If you do benefit please contact our office so we can send a prescription to your pharmacy. We will send a prescription for albuterol .  You can use 2 puffs up to every 4 hours if needed for shortness of breath, chest tightness, wheezing.  You might also benefit from taking 2 puffs prior to exercising. Continue your CPAP every night as you have been using it. Congratulations on decreasing your cigarettes.  Keep up the good work.  Our goal will be to continue to decrease until you are at the point where we can consider setting a quit date and stopping altogether. Get your surveillance CT scan of the chest in April 2026. Follow Dr. Shelah in April after your CT so we can review those results together.  Please call sooner if you have any problems

## 2024-05-16 NOTE — Progress Notes (Unsigned)
 "  Subjective:    Patient ID: Kristin Rose, female    DOB: 11/07/1960, 64 y.o.   MRN: [id number]  HPI  ROV 05/16/2024 --follow-up visit for 64 year old woman with a history of active tobacco use and shortness of breath.  She has a history of newly diagnosed obstructive sleep apnea on CPAP.  I have seen her for this, her dyspnea and also pulmonary nodules noted on CT scan of the chest.  Plan for her lung cancer screening CT in 08/2024 for surveillance She has some insomnia. 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 She does have some exertional SOB. Has gained some weight since the Summer. She is smoking about 10 cig a day.   Pulmonary function testing performed today and reviewed by me shows evidence for mixed obstruction and restriction without a bronchodilator response, restricted lung volumes, decreased diffusion capacity that corrects to the normal range when adjusted for alveolar volume.   Review of Systems As per HPI  Past Medical History:  Diagnosis Date   Breast cancer (HCC)    Cancer of left breast (HCC) 01/2004   Treatment with lumpectomy, radiation and in 2008 finished with Tamoxifen.   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   GERD (gastroesophageal reflux disease)    Heart murmur    as a child   Migraine    weekly (11/06/2014)   Personal history of radiation therapy     Family History  Problem Relation Age of Onset   Pulmonary embolism Mother    Diabetes type II Mother    Hypertension Mother    Hypertension Father    Glaucoma Father    Lung cancer Father        2005   Cancer Sister 63       Breast   Breast cancer Sister    Diabetes Brother    Schizophrenia Brother    Diabetes Daughter        Obese   Crohn's disease Brother    Diabetes Brother      Social History    Socioeconomic History   Marital status: Married    Spouse name: Takiera Mayo   Number of children: 2   Years of education: 12   Highest education level: High school graduate  Occupational History   Occupation: Forensic Scientist work  Tobacco Use   Smoking status: Every Day    Current packs/day: 0.50    Average packs/day: 0.5 packs/day for 30.0 years (15.0 ttl pk-yrs)    Types: Cigarettes   Smokeless tobacco: Never   Tobacco comments:    Has quit with Chantix before--quit for up to 2 years and then goes back to smoking during stress.    Smokes 1/2 ppd 05/16/2024  Vaping Use   Vaping status: Some Days  Substance and Sexual Activity   Alcohol use: Not Currently    Comment: used to drink; stopped ~ 2010   Drug use: No   Sexual activity: Not Currently    Birth control/protection: Surgical  Other Topics Concern   Not on file  Social History Narrative   Lives with husband.   Husband disabled   She is doing well now that she is working (2020)   Social Drivers of Health   Tobacco Use: High Risk (05/16/2024)   Patient History    Smoking Tobacco  Use: Every Day    Smokeless Tobacco Use: Never    Passive Exposure: Not on file  Financial Resource Strain: Not on file  Food Insecurity: Not on file  Transportation Needs: Not on file  Physical Activity: Not on file  Stress: Not on file  Social Connections: Not on file  Intimate Partner Violence: Not on file  Depression (PHQ2-9): Not on file  Alcohol Screen: Not on file  Housing: Not on file  Utilities: Not on file  Health Literacy: Not on file   - Tobacco: Current smoker, 0.5 ppd x 45 years (22 pack-years) - Employment: Designer, jewellery, 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 30 tablet 11   aspirin -acetaminophen -caffeine (EXCEDRIN MIGRAINE) 250-250-65 MG per tablet Take 2 tablets by mouth every 8 (eight) hours as needed for headache.      B Complex-C (SUPER B COMPLEX PO) Take by mouth. 1 daily     Biotin 10000 MCG TABS Take by mouth. 1 daily     Cholecalciferol (VITAMIN D3) 50 MCG (2000 UT) TABS Take by mouth. 1 daily     LORazepam  (ATIVAN ) 0.5 MG tablet Take 1-2 pills 30 minutes prior to MRI 2 tablet 0   losartan -hydrochlorothiazide  (HYZAAR) 100-25 MG tablet Take 1 tablet by mouth every morning. 30 tablet 11   metoprolol  (TOPROL -XL) 200 MG 24 hr tablet Take 200 mg by mouth daily.     mometasone  (NASONEX ) 50 MCG/ACT nasal spray 2 SPRAYS IN EACH NOSTRIL ONCE DAILY 17 g 0   nicotine  (NICODERM CQ ) 14 mg/24hr patch Place 1 patch (14 mg total) onto the skin daily. Start after 21 mg dose done 28 patch 0   Olopatadine  HCl (PAZEO) 0.7 % SOLN Apply 1 drop to eye daily. 1 drop in each eye daily 1 Bottle 11   omeprazole  (PRILOSEC) 40 MG capsule Take 40 mg by mouth daily.     OVER THE COUNTER MEDICATION Nature's Bounty sleep Aide. 1 daily at bedtime     rosuvastatin (CRESTOR) 10 MG tablet Take 1 tablet by mouth daily.     SUMAtriptan  (IMITREX ) 100 MG tablet Take 1 tablet (100 mg total) by mouth every 2 (two) hours as needed for migraine. May repeat in 2 hours if headache persists or recurs. 10 tablet 6   vitamin E 1000 UNIT capsule Take 1,000 Units by mouth daily.     zinc gluconate 50 MG tablet Take 50 mg by mouth daily.     zolpidem  (AMBIEN ) 10 MG tablet Take 10 mg by mouth at bedtime as needed.     naproxen  (NAPROSYN ) 500 MG tablet Take 1 tablet (500 mg total) by mouth 2 (two) times daily. (Patient not taking: Reported on 05/16/2024) 30 tablet 0   QULIPTA 60 MG TABS Take 1 tablet by mouth daily. (Patient not taking: Reported on 05/16/2024)     No current facility-administered medications on file prior to  visit.         Objective:   Physical Exam Vitals:   05/16/24 1057  BP: 130/78  Pulse: 75  SpO2: 98%   Gen: Pleasant, overweight woman, in no distress,  normal affect  ENT: No lesions,  mouth clear,  oropharynx clear,  no postnasal drip  Neck: No JVD, no stridor  Lungs: No use of accessory muscles, no crackles or wheezing on normal respiration, no wheeze on forced expiration  Cardiovascular: RRR, heart sounds normal, no murmur or gallops, no peripheral edema  Musculoskeletal: No deformities, no cyanosis or clubbing  Neuro: alert, awake, non focal  Skin: Warm, no lesions or rash       Assessment & Plan:   No problem-specific Assessment & Plan notes found for this encounter.    Lamar Chris, MD, PhD 05/16/2024, 11:17 AM [facility] [phone] or if no answer before 7:00PM call [phone] For any issues after 7:00PM please call eLink [phone]  "

## 2024-05-16 NOTE — Progress Notes (Signed)
 Full pft performed today

## 2024-05-16 NOTE — Progress Notes (Unsigned)
Patient seen in the office today and instructed on use of Spiriva.  Patient expressed understanding and demonstrated technique. 

## 2024-05-22 DIAGNOSIS — J449 Chronic obstructive pulmonary disease, unspecified: Secondary | ICD-10-CM | POA: Insufficient documentation

## 2024-05-22 NOTE — Assessment & Plan Note (Signed)
 We reviewed your pulmonary function testing today.  There is some evidence for mild COPD present. We will try using a new medication called Spiriva.  Take 2 puffs once daily.  We will try this for several weeks to see if it helps your breathing.  If you do benefit please contact our office so we can send a prescription to your pharmacy. We will send a prescription for albuterol .  You can use 2 puffs up to every 4 hours if needed for shortness of breath, chest tightness, wheezing.  You might also benefit from taking 2 puffs prior to exercising.

## 2024-05-22 NOTE — Assessment & Plan Note (Signed)
 Continue CPAP qhs

## 2024-05-22 NOTE — Assessment & Plan Note (Signed)
 Congratulations on decreasing your cigarettes.  Keep up the good work.  Our goal will be to continue to decrease until you are at the point where we can consider setting a quit date and stopping altogether. Get your surveillance CT scan of the chest in April 2026. Follow Dr. Shelah in April after your CT so we can review those results together.  Please call sooner if you have any problems

## 2024-08-19 ENCOUNTER — Other Ambulatory Visit

## 2024-08-28 ENCOUNTER — Ambulatory Visit: Admitting: Emergency Medicine
